# Patient Record
Sex: Female | Born: 1985
Health system: Southern US, Community
[De-identification: ages and names within clinical notes are randomized; demographics above are authoritative.]

## PROBLEM LIST (undated history)

## (undated) DIAGNOSIS — L732 Hidradenitis suppurativa: Secondary | ICD-10-CM

## (undated) DIAGNOSIS — I10 Essential (primary) hypertension: Secondary | ICD-10-CM

---
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
## 2006-08-26 ENCOUNTER — Emergency Department (HOSPITAL_COMMUNITY): Admission: EM | Admit: 2006-08-26 | Discharge: 2006-08-26 | Payer: Self-pay | Admitting: Emergency Medicine

## 2006-09-27 ENCOUNTER — Emergency Department (HOSPITAL_COMMUNITY): Admission: EM | Admit: 2006-09-27 | Discharge: 2006-09-27 | Payer: Self-pay | Admitting: Emergency Medicine

## 2006-12-20 ENCOUNTER — Inpatient Hospital Stay (HOSPITAL_COMMUNITY): Admission: AD | Admit: 2006-12-20 | Discharge: 2006-12-21 | Payer: Self-pay | Admitting: Family Medicine

## 2006-12-20 ENCOUNTER — Emergency Department (HOSPITAL_COMMUNITY): Admission: EM | Admit: 2006-12-20 | Discharge: 2006-12-20 | Payer: Self-pay | Admitting: Emergency Medicine

## 2006-12-21 ENCOUNTER — Encounter (INDEPENDENT_AMBULATORY_CARE_PROVIDER_SITE_OTHER): Payer: Self-pay | Admitting: Gynecology

## 2006-12-21 ENCOUNTER — Ambulatory Visit (HOSPITAL_COMMUNITY): Admission: AD | Admit: 2006-12-21 | Discharge: 2006-12-21 | Payer: Self-pay | Admitting: Gynecology

## 2006-12-21 ENCOUNTER — Ambulatory Visit: Payer: Self-pay | Admitting: Gynecology

## 2006-12-21 HISTORY — PX: DILATION AND EVACUATION: SHX1459

## 2007-02-13 ENCOUNTER — Emergency Department (HOSPITAL_COMMUNITY): Admission: EM | Admit: 2007-02-13 | Discharge: 2007-02-13 | Payer: Self-pay | Admitting: Emergency Medicine

## 2007-03-01 ENCOUNTER — Ambulatory Visit: Payer: Self-pay | Admitting: Obstetrics & Gynecology

## 2007-07-09 ENCOUNTER — Inpatient Hospital Stay (HOSPITAL_COMMUNITY): Admission: AD | Admit: 2007-07-09 | Discharge: 2007-07-09 | Payer: Self-pay | Admitting: Obstetrics & Gynecology

## 2007-07-11 ENCOUNTER — Inpatient Hospital Stay (HOSPITAL_COMMUNITY): Admission: AD | Admit: 2007-07-11 | Discharge: 2007-07-11 | Payer: Self-pay | Admitting: Obstetrics & Gynecology

## 2007-07-18 ENCOUNTER — Inpatient Hospital Stay (HOSPITAL_COMMUNITY): Admission: AD | Admit: 2007-07-18 | Discharge: 2007-07-18 | Payer: Self-pay | Admitting: Obstetrics & Gynecology

## 2007-08-01 ENCOUNTER — Ambulatory Visit: Payer: Self-pay | Admitting: Obstetrics and Gynecology

## 2007-12-24 ENCOUNTER — Emergency Department (HOSPITAL_COMMUNITY): Admission: EM | Admit: 2007-12-24 | Discharge: 2007-12-24 | Payer: Self-pay | Admitting: Emergency Medicine

## 2008-04-13 ENCOUNTER — Emergency Department (HOSPITAL_COMMUNITY): Admission: EM | Admit: 2008-04-13 | Discharge: 2008-04-13 | Payer: Self-pay | Admitting: Family Medicine

## 2008-05-19 ENCOUNTER — Emergency Department (HOSPITAL_COMMUNITY): Admission: EM | Admit: 2008-05-19 | Discharge: 2008-05-19 | Payer: Self-pay | Admitting: Emergency Medicine

## 2009-01-16 ENCOUNTER — Inpatient Hospital Stay (HOSPITAL_COMMUNITY): Admission: AD | Admit: 2009-01-16 | Discharge: 2009-01-16 | Payer: Self-pay | Admitting: Obstetrics & Gynecology

## 2009-02-23 ENCOUNTER — Inpatient Hospital Stay (HOSPITAL_COMMUNITY): Admission: AD | Admit: 2009-02-23 | Discharge: 2009-02-23 | Payer: Self-pay | Admitting: Obstetrics & Gynecology

## 2009-04-04 ENCOUNTER — Emergency Department (HOSPITAL_COMMUNITY): Admission: EM | Admit: 2009-04-04 | Discharge: 2009-04-04 | Payer: Self-pay | Admitting: Emergency Medicine

## 2009-11-11 ENCOUNTER — Inpatient Hospital Stay (HOSPITAL_COMMUNITY): Admission: AD | Admit: 2009-11-11 | Discharge: 2009-11-11 | Payer: Self-pay | Admitting: Family Medicine

## 2010-02-22 ENCOUNTER — Inpatient Hospital Stay (HOSPITAL_COMMUNITY): Admission: AD | Admit: 2010-02-22 | Discharge: 2010-02-22 | Payer: Self-pay | Admitting: Obstetrics and Gynecology

## 2010-06-20 NOTE — L&D Delivery Note (Signed)
Delivery Note   Pt progressed to complete at 1945, pt then pushed well, and vertex descended, FHR with early/variable decels, overall reassuring and there was continuous descent  At 8:53 PM a viable female was delivered via Vaginal, Spontaneous Delivery (Presentation: Left Occiput Anterior).  APGAR: 8, 9; weight 7 lb 10 oz (3459 g).   Placenta status: Intact, Spontaneous.  Cord: 3 vessels with the following complications: None.   Infant was placed skin to skin on mom's abdomen  Anesthesia: Epidural  Episiotomy: None Lacerations:  Left Sulcus Suture Repair: 3.0 vicryl rapide Est. Blood Loss (mL): 400  Mom to postpartum.  Baby to nursery-stable. Placenta to pathology, secondary to prolonged rupture  Dr Estanislado Pandy notified  Malissa Hippo 02/11/2011, 1:22 AM

## 2010-07-16 ENCOUNTER — Inpatient Hospital Stay (HOSPITAL_COMMUNITY)
Admission: AD | Admit: 2010-07-16 | Discharge: 2010-07-16 | Payer: Self-pay | Source: Home / Self Care | Attending: Obstetrics & Gynecology | Admitting: Obstetrics & Gynecology

## 2010-07-26 ENCOUNTER — Inpatient Hospital Stay (HOSPITAL_COMMUNITY)
Admission: AD | Admit: 2010-07-26 | Discharge: 2010-07-26 | Disposition: A | Discharge status: Home / Self Care | Payer: Medicaid Other | Source: Ambulatory Visit | Attending: Obstetrics & Gynecology | Admitting: Obstetrics & Gynecology

## 2010-07-26 ENCOUNTER — Inpatient Hospital Stay (HOSPITAL_COMMUNITY): Payer: Medicaid Other

## 2010-07-26 ENCOUNTER — Other Ambulatory Visit: Payer: Self-pay | Admitting: Obstetrics & Gynecology

## 2010-07-26 ENCOUNTER — Encounter (HOSPITAL_COMMUNITY): Payer: Self-pay | Admitting: Radiology

## 2010-07-26 DIAGNOSIS — IMO0002 Reserved for concepts with insufficient information to code with codable children: Secondary | ICD-10-CM | POA: Insufficient documentation

## 2010-07-26 DIAGNOSIS — O2650 Maternal hypotension syndrome, unspecified trimester: Secondary | ICD-10-CM

## 2010-07-26 DIAGNOSIS — W1809XA Striking against other object with subsequent fall, initial encounter: Secondary | ICD-10-CM | POA: Insufficient documentation

## 2010-07-26 DIAGNOSIS — R51 Headache: Secondary | ICD-10-CM | POA: Insufficient documentation

## 2010-09-02 LAB — WET PREP, GENITAL
Trich, Wet Prep: NONE SEEN
Yeast Wet Prep HPF POC: NONE SEEN

## 2010-09-02 LAB — GC/CHLAMYDIA PROBE AMP, GENITAL: GC Probe Amp, Genital: NEGATIVE

## 2010-09-06 LAB — URINALYSIS, ROUTINE W REFLEX MICROSCOPIC
Glucose, UA: NEGATIVE mg/dL
Hgb urine dipstick: NEGATIVE
Ketones, ur: 15 mg/dL — AB
Specific Gravity, Urine: >1.03 — ABNORMAL HIGH (ref 1.005–1.030)

## 2010-09-06 LAB — POCT PREGNANCY, URINE: Preg Test, Ur: NEGATIVE

## 2010-09-06 LAB — WET PREP, GENITAL: Yeast Wet Prep HPF POC: NONE SEEN

## 2010-09-06 LAB — GC/CHLAMYDIA PROBE AMP, GENITAL: Chlamydia, DNA Probe: NEGATIVE

## 2010-09-24 LAB — WET PREP, GENITAL
Trich, Wet Prep: NONE SEEN
WBC, Wet Prep HPF POC: NONE SEEN
Yeast Wet Prep HPF POC: NONE SEEN

## 2010-09-24 LAB — GC/CHLAMYDIA PROBE AMP, GENITAL: Chlamydia, DNA Probe: NEGATIVE

## 2010-09-26 LAB — WET PREP, GENITAL: Trich, Wet Prep: NONE SEEN

## 2010-09-26 LAB — GC/CHLAMYDIA PROBE AMP, GENITAL: GC Probe Amp, Genital: NEGATIVE

## 2010-11-02 NOTE — Op Note (Signed)
NAME:  Desiree Daniel, Desiree Daniel      ACCOUNT NO.:  [id number]   MEDICAL RECORD NO.:  [id number]          PATIENT TYPE:  AMB   LOCATION:  [facility]                          FACILITY:  [facility]   PHYSICIAN:  Ginger Carne, MD  DATE OF BIRTH:  27-Jan-1986   DATE OF PROCEDURE:  12/21/2006  DATE OF DISCHARGE:                               OPERATIVE REPORT   PREOPERATIVE DIAGNOSIS:  Is 16-3/[redacted] weeks gestation, incomplete abortion  and shortened cervix.   POSTOPERATIVE DIAGNOSIS:  Is 16-3/[redacted] weeks gestation, incomplete abortion  and shortened cervix.  Chorioamnionitis.   OPERATIVE PROCEDURE:  Is aspiration, dilation and extraction.   SURGEON:  Ginger Carne, M.D.   ASSISTANT:  None.   COMPLICATIONS:  None immediate.   ESTIMATED BLOOD LOSS:  Minimal.   ANESTHESIA:  Spinal.   SPECIMEN:  Products of conception to pathology.   OPERATIVE FINDINGS:  The patient had previously diagnosed 1.6 cm cervix  and in the course of being transferred to the operating theater for  placement of Shirodkar cerclage her membranes had ruptured. Upon the  initial viewing in the operating room there was purulent white material  emanating from the endocervical os.  Products of conception noted,  uterine cavity was smooth.  Uterus sounded to 18 cm.  The cavity was  smooth at the end of the procedure.   OPERATIVE PROCEDURE:  The patient prepped and draped in usual fashion  and placed lithotomy position.  Betadine solution used for antiseptic  and the patient was catheterized prior to procedure.  After adequate  spinal analgesia, tenaculum placed on the anterior lip of the cervix.  Minimal dilatation was required to accommodate a #14 suction curette.  Suction and sharp curettage was utilized for removal of products.  Minimal bleeding noted at the end of the procedure.  Cavity clean at the  end of the procedure.  The patient returned to the post anesthesia  recovery room in excellent condition.      Ginger Carne, MD  Electronically Signed    SHB/MEDQ  D:  12/21/2006  T:  12/21/2006  Job:  [id number]

## 2010-11-02 NOTE — Group Therapy Note (Signed)
NAME:  Desiree Daniel, Desiree Daniel NO.:  [id number]   MEDICAL RECORD NO.:  [id number]          PATIENT TYPE:  WOC   LOCATION:  [facility]                   FACILITY:  [facility]   PHYSICIAN:  Argentina Donovan, MD        DATE OF BIRTH:  [birth year]   DATE OF SERVICE:                                  CLINIC NOTE   The patient is a 25 year old African American female gravida 2, para 0-0-  2-0 who, last year, in July had a D&C after miscarriage of a 16-week  gestation and she recently was seen in the MAU after spontaneous 6-week  miscarriage, complete.  Beta HCG was down to normal on her second  repeat. The patient was in the health department in November and had a  Pap smear at that time.  This time she was in no complaints and desires  birth control.  We placed her on Yasmin and given 3 sample packs  instructed her to the health department as she has no insurance and no  job at this time.  She is looking.  She uses also condoms with sex and  we consulted her about that.  She is not planning pregnancy.  We have  told her should she become pregnant, she should be on prenatal vitamins  with folic acid.   EXAMINATION:  External genitalia is normal.  BUS is within normal  limits.  Vagina is clean, well rugated.  Cervix clean, parous and  nulliparous.  The uterus and adnexa are normal.   IMPRESSION:  Normal gynecological examination.           ______________________________  Argentina Donovan, MD     PR/MEDQ  D:  08/01/2007  T:  08/02/2007  Job:  [id number]

## 2011-02-09 ENCOUNTER — Encounter (HOSPITAL_COMMUNITY): Payer: Self-pay | Admitting: *Deleted

## 2011-02-09 ENCOUNTER — Inpatient Hospital Stay (HOSPITAL_COMMUNITY)
Admission: AD | Admit: 2011-02-09 | Discharge: 2011-02-12 | DRG: 775 | Disposition: A | Discharge status: Home / Self Care | Payer: Medicaid Other | Source: Ambulatory Visit | Attending: Obstetrics and Gynecology | Admitting: Obstetrics and Gynecology

## 2011-02-09 DIAGNOSIS — O4290 Premature rupture of membranes, unspecified as to length of time between rupture and onset of labor, unspecified weeks of gestation: Principal | ICD-10-CM | POA: Diagnosis present

## 2011-02-09 LAB — POCT FERN TEST: Fern Test: POSITIVE

## 2011-02-09 NOTE — Progress Notes (Signed)
Manfred Arch, CNM at bedside. Fern collected. VE done.
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
## 2011-02-09 NOTE — Progress Notes (Signed)
SSE done per cnm.  Fern collected.

## 2011-02-09 NOTE — Progress Notes (Signed)
Pt in for possible srom. States she was walking up stairs and had a gush of fluid.  States she did have to pee, but doesn't believe she did.  Has continued to leak since then.  Has not had to wear a pad.  Denies any bleeding or pain.  + FM.

## 2011-02-10 ENCOUNTER — Encounter (HOSPITAL_COMMUNITY): Payer: Self-pay | Admitting: Anesthesiology

## 2011-02-10 ENCOUNTER — Inpatient Hospital Stay (HOSPITAL_COMMUNITY): Payer: Medicaid Other | Admitting: Anesthesiology

## 2011-02-10 ENCOUNTER — Encounter (HOSPITAL_COMMUNITY): Payer: Self-pay | Admitting: *Deleted

## 2011-02-10 LAB — RPR: RPR: NONREACTIVE

## 2011-02-10 LAB — STREP B DNA PROBE: GBS: NEGATIVE

## 2011-02-10 LAB — HEPATITIS B SURFACE ANTIGEN: Hepatitis B Surface Ag: NEGATIVE

## 2011-02-10 LAB — CBC
HCT: 33.1 % — ABNORMAL LOW (ref 36.0–46.0)
MCHC: 34.1 g/dL (ref 30.0–36.0)
MCV: 84.9 fL (ref 78.0–100.0)
Platelets: 260 10*3/uL (ref 150–400)
RDW: 14.7 % (ref 11.5–15.5)
WBC: 10.5 10*3/uL (ref 4.0–10.5)

## 2011-02-10 LAB — HIV ANTIBODY (ROUTINE TESTING W REFLEX): HIV: NONREACTIVE

## 2011-02-10 LAB — RUBELLA ANTIBODY, IGM: Rubella: NON-IMMUNE/NOT IMMUNE

## 2011-02-10 MED ORDER — LIDOCAINE HCL 1.5 % IJ SOLN
INTRAMUSCULAR | Status: DC | PRN
  Administered 2011-02-10 (×2): 5 mL via EPIDURAL

## 2011-02-10 MED ORDER — ONDANSETRON HCL 4 MG/2ML IJ SOLN: 4.0000 mg | Freq: Four times a day (QID) | INTRAMUSCULAR | Status: DC | PRN

## 2011-02-10 MED ORDER — SODIUM CHLORIDE 0.9 % IV SOLN
3.0000 g | Freq: Once | INTRAVENOUS | Status: AC
  Administered 2011-02-10: 3 g via INTRAVENOUS
  Filled 2011-02-10: qty 3

## 2011-02-10 MED ORDER — PHENYLEPHRINE 40 MCG/ML (10ML) SYRINGE FOR IV PUSH (FOR BLOOD PRESSURE SUPPORT)
80.0000 ug | PREFILLED_SYRINGE | INTRAVENOUS | Status: DC | PRN
  Filled 2011-02-10: qty 5

## 2011-02-10 MED ORDER — LACTATED RINGERS IV SOLN
500.0000 mL | INTRAVENOUS | Status: DC | PRN
  Administered 2011-02-10: 1000 mL via INTRAVENOUS
  Administered 2011-02-10: 125 mL via INTRAVENOUS
  Administered 2011-02-10: 300 mL via INTRAVENOUS

## 2011-02-10 MED ORDER — SODIUM CHLORIDE 0.9 % IV SOLN: 250.0000 mL | INTRAVENOUS | Status: DC

## 2011-02-10 MED ORDER — ZOLPIDEM TARTRATE 10 MG PO TABS
10.0000 mg | ORAL_TABLET | Freq: Every evening | ORAL | Status: DC | PRN
  Administered 2011-02-10: 10 mg via ORAL

## 2011-02-10 MED ORDER — OXYCODONE-ACETAMINOPHEN 5-325 MG PO TABS: 2.0000 | ORAL_TABLET | ORAL | Status: DC | PRN

## 2011-02-10 MED ORDER — DIPHENHYDRAMINE HCL 50 MG/ML IJ SOLN: 12.5000 mg | INTRAMUSCULAR | Status: DC | PRN

## 2011-02-10 MED ORDER — CITRIC ACID-SODIUM CITRATE 334-500 MG/5ML PO SOLN
30.0000 mL | ORAL | Status: DC | PRN
  Filled 2011-02-10: qty 15

## 2011-02-10 MED ORDER — EPHEDRINE 5 MG/ML INJ
10.0000 mg | INTRAVENOUS | Status: DC | PRN
  Filled 2011-02-10: qty 4

## 2011-02-10 MED ORDER — IBUPROFEN 600 MG PO TABS: 600.0000 mg | ORAL_TABLET | Freq: Four times a day (QID) | ORAL | Status: DC | PRN

## 2011-02-10 MED ORDER — TERBUTALINE SULFATE 1 MG/ML IJ SOLN: 0.2500 mg | Freq: Once | INTRAMUSCULAR | Status: AC | PRN

## 2011-02-10 MED ORDER — OXYTOCIN 20 UNITS IN LACTATED RINGERS INFUSION - SIMPLE
1.0000 m[IU]/min | INTRAVENOUS | Status: DC
  Administered 2011-02-10: 333 m[IU]/min via INTRAVENOUS
  Administered 2011-02-10: 1 m[IU]/min via INTRAVENOUS
  Filled 2011-02-10: qty 1000

## 2011-02-10 MED ORDER — BUTORPHANOL TARTRATE 2 MG/ML IJ SOLN: 1.0000 mg | INTRAMUSCULAR | Status: DC | PRN

## 2011-02-10 MED ORDER — OXYTOCIN 20 UNITS IN LACTATED RINGERS INFUSION - SIMPLE: 125.0000 mL/h | INTRAVENOUS | Status: AC

## 2011-02-10 MED ORDER — SODIUM CHLORIDE 0.9 % IJ SOLN: 3.0000 mL | INTRAMUSCULAR | Status: DC | PRN

## 2011-02-10 MED ORDER — LIDOCAINE HCL (PF) 1 % IJ SOLN
30.0000 mL | INTRAMUSCULAR | Status: DC | PRN
  Filled 2011-02-10 (×2): qty 30

## 2011-02-10 MED ORDER — FLEET ENEMA 7-19 GM/118ML RE ENEM: 1.0000 | ENEMA | RECTAL | Status: DC | PRN

## 2011-02-10 MED ORDER — LACTATED RINGERS IV SOLN
500.0000 mL | Freq: Once | INTRAVENOUS | Status: AC
  Administered 2011-02-10: 1000 mL via INTRAVENOUS

## 2011-02-10 MED ORDER — FENTANYL 2.5 MCG/ML BUPIVACAINE 1/10 % EPIDURAL INFUSION (WH - ANES)
14.0000 mL/h | INTRAMUSCULAR | Status: DC
  Administered 2011-02-10 (×4): 14 mL/h via EPIDURAL
  Filled 2011-02-10 (×3): qty 60

## 2011-02-10 MED ORDER — OXYTOCIN BOLUS FROM INFUSION
500.0000 mL | Freq: Once | INTRAVENOUS | Status: DC
  Filled 2011-02-10: qty 500

## 2011-02-10 MED ORDER — SODIUM CHLORIDE 0.9 % IJ SOLN: 3.0000 mL | Freq: Two times a day (BID) | INTRAMUSCULAR | Status: DC

## 2011-02-10 MED ORDER — ACETAMINOPHEN 325 MG PO TABS
650.0000 mg | ORAL_TABLET | ORAL | Status: DC | PRN
  Administered 2011-02-10: 650 mg via ORAL
  Filled 2011-02-10: qty 2

## 2011-02-10 NOTE — Progress Notes (Signed)
  Subjective: Now on [employer].  Unaware of contractions.  Objective: BP 121/74  Pulse 106  Temp(Src) 98.1 F (36.7 C) (Oral)  Resp 20  Ht 5\' 4"  (1.626 m)  Wt 85.004 kg (187 lb 6.4 oz)  BMI 32.17 kg/m2     FHT:  FHR: 130-140 bpm, variability: moderate,  accelerations:  Present,  decelerations:  Absent UC:   irregular, every 5-8 minutes  Labs: Lab Results  Component Value Date   WBC 10.5 02/10/2011   HGB 11.3* 02/10/2011   HCT 33.1* 02/10/2011   MCV 84.9 02/10/2011   PLT 260 02/10/2011    Assessment / Plan: PROM prior to labor GBS negative Will continue to observe, Ambien prn If no change in labor status by 9 am, will start Pitocin augmentation.  Hinley Brimage L 02/10/2011, 1:36 AM

## 2011-02-10 NOTE — Progress Notes (Signed)
   Subjective: Pt breathing through ctx, now getting uncomfortable, requests pain medication  Objective: BP 106/60  Pulse 113  Temp(Src) 98.5 F (36.9 C) (Oral)  Resp 20  Ht 5\' 4"  (1.626 m)  Wt 85.004 kg (187 lb 6.4 oz)  BMI 32.17 kg/m2      FHT:  FHR: 130 bpm, variability: moderate,  accelerations:  Abscent,  decelerations:  Absent UC:   regular, every 2-3 minutes SVE:   Dilation: 1 Effacement (%): 60 Station: -1 Exam by:: Manfred Arch, CNM  Labs: Lab Results  Component Value Date   WBC 10.5 02/10/2011   HGB 11.3* 02/10/2011   HCT 33.1* 02/10/2011   MCV 84.9 02/10/2011   PLT 260 02/10/2011    Assessment / Plan: Spontaneous labor, progressing normally  Labor: Progressing normally Preeclampsia:  no signs or symptoms of toxicity Fetal Wellbeing:  Category I Pain Control:  Epidural will administer now I/D:  n/a Anticipated MOD:  NSVD   Ivelisse Culverhouse M 02/10/2011, 8:06 AM

## 2011-02-10 NOTE — Progress Notes (Signed)
   Subjective: Remains comfortable with epidural  Objective: BP 132/75  Pulse 95  Temp(Src) 98.3 F (36.8 C) (Oral)  Resp 20  Ht 5\' 4"  (1.626 m)  Wt 85.004 kg (187 lb 6.4 oz)  BMI 32.17 kg/m2  SpO2 99%      FHT:  FHR: 130 bpm, variability: minimal ,  accelerations:  Abscent,  decelerations:  Present early, variable UC:   regular, every 2-3 minutes SVE:   Dilation: 6 Effacement (%): 100 Station: -1 Exam by:: felkel,rn  Labs: Lab Results  Component Value Date   WBC 10.5 02/10/2011   HGB 11.3* 02/10/2011   HCT 33.1* 02/10/2011   MCV 84.9 02/10/2011   PLT 260 02/10/2011    Assessment / Plan: SROM at 2100 on 8/22   Labor: Progressing normally Preeclampsia:  no signs or symptoms of toxicity Fetal Wellbeing:  Category II Pain Control:  Epidural I/D:  n/a Anticipated MOD:  NSVD  Discussed Dr Pura Spice 02/10/2011, 3:39 PM

## 2011-02-10 NOTE — Progress Notes (Signed)
Subjective: Has rested through night, s/p Ambien.  No increase in UCs at present.  Objective: BP 106/60  Pulse 113  Temp(Src) 98.2 F (36.8 C) (Oral)  Resp 20  Ht 5\' 4"  (1.626 m)  Wt 85.004 kg (187 lb 6.4 oz)  BMI 32.17 kg/m2      FHT:  FHR: 150s bpm, variability: moderate,  accelerations:  Present,  decelerations:  Absent.  10x 10 accels at intervals. UC:   regular, every 4 minutes, mild. Leaking clear fluid  Labs: Lab Results  Component Value Date   WBC 10.5 02/10/2011   HGB 11.3* 02/10/2011   HCT 33.1* 02/10/2011   MCV 84.9 02/10/2011   PLT 260 02/10/2011    Assessment / Plan: SROM, latent phase labor GBS negative If no labor increase this am, anticipate initiation of pitocin augmentation. Patient agreeable with plan.   Isaul Landi L 02/10/2011, 6:43 AM

## 2011-02-10 NOTE — Anesthesia Procedure Notes (Addendum)
Epidural Patient location during procedure: OB Start time: 02/10/2011 8:52 AM  Staffing Performed by: anesthesiologist   Preanesthetic Checklist Completed: patient identified, site marked, surgical consent, pre-op evaluation, timeout performed, IV checked, risks and benefits discussed and monitors and equipment checked  Epidural Patient position: sitting Prep: site prepped and draped and DuraPrep Patient monitoring: continuous pulse ox and blood pressure Approach: midline Injection technique: LOR air and LOR saline  Needle:  Needle type: Tuohy  Needle gauge: 17 G Needle length: 9 cm Needle insertion depth: 7 cm Catheter type: closed end flexible Catheter size: 19 Gauge Catheter at skin depth: 12 cm Test dose: negative  Assessment Events: blood not aspirated, injection not painful, no injection resistance, negative IV test and no paresthesia  Additional Notes Patient identified.  Risk benefits discussed including failed block, incomplete pain control, headache, nerve damage, paralysis, blood pressure changes, nausea, vomiting, reactions to medication both toxic or allergic, and postpartum back pain.  Patient expressed understanding and wished to proceed.  All questions were answered.  Sterile technique used throughout procedure and epidural site dressed with sterile barrier dressing. No paresthesia or other complications noted.The patient did not experience any signs of intravascular injection such as tinnitus or metallic taste in mouth nor signs of intrathecal spread such as rapid motor block. Please see nursing notes for vital signs.

## 2011-02-10 NOTE — Progress Notes (Addendum)
  Subjective: Comfortable after epidural,   Objective: BP 121/66  Pulse 86  Temp(Src) 97.6 F (36.4 C) (Oral)  Resp 18  Ht 5\' 4"  (1.626 m)  Wt 85.004 kg (187 lb 6.4 oz)  BMI 32.17 kg/m2  SpO2 99%      FHT:  FHR: 130 bpm, variability: moderate,  accelerations:  Abscent,  decelerations:  Present some early variables, few with late component UC:   regular, every 2-3 minutes SVE:   Dilation: 4 Effacement (%): 90 Station: -1 Exam by:: felkel,rn  Labs: Lab Results  Component Value Date   WBC 10.5 02/10/2011   HGB 11.3* 02/10/2011   HCT 33.1* 02/10/2011   MCV 84.9 02/10/2011   PLT 260 02/10/2011    Assessment / Plan: Spontaneous labor, progressing normally  Labor: Progressing normally Preeclampsia:  no signs or symptoms of toxicity Fetal Wellbeing:  Category II Pain Control:  Epidural I/D:  n/a Anticipated MOD:  NSVD  Will start amnioinfusion Will update dr Pura Spice 02/10/2011, 10:55 AM

## 2011-02-10 NOTE — Progress Notes (Signed)
Will call back with bed.  prioritizing pt bed placement.

## 2011-02-10 NOTE — Progress Notes (Signed)
Pt may go to room 172.

## 2011-02-10 NOTE — Anesthesia Preprocedure Evaluation (Signed)
Anesthesia Evaluation  Name, MR# and DOB Patient awake  General Assessment Comment  Reviewed: Allergy & Precautions, H&P , Patient's Chart, lab work & pertinent test results  Airway Mallampati: II TM Distance: >3 FB Neck ROM: full    Dental No notable dental hx.    Pulmonary  clear to auscultation  pulmonary exam normalPulmonary Exam Normal breath sounds clear to auscultation none    Cardiovascular regular Normal    Neuro/Psych Negative Neurological ROS  Negative Psych ROS  GI/Hepatic/Renal negative GI ROS  negative Liver ROS  negative Renal ROS        Endo/Other  Negative Endocrine ROS (+)      Abdominal   Musculoskeletal   Hematology negative hematology ROS (+)   Peds  Reproductive/Obstetrics (+) Pregnancy    Anesthesia Other Findings             Anesthesia Physical Anesthesia Plan  ASA: II  Anesthesia Plan: Epidural   Post-op Pain Management:    Induction:   Airway Management Planned:   Additional Equipment:   Intra-op Plan:   Post-operative Plan:   Informed Consent: I have reviewed the patients History and Physical, chart, labs and discussed the procedure including the risks, benefits and alternatives for the proposed anesthesia with the patient or authorized representative who has indicated his/her understanding and acceptance.     Plan Discussed with:   Anesthesia Plan Comments:         Anesthesia Quick Evaluation  

## 2011-02-11 ENCOUNTER — Other Ambulatory Visit: Payer: Self-pay | Admitting: Obstetrics and Gynecology

## 2011-02-11 LAB — CBC
HCT: 27.7 % — ABNORMAL LOW (ref 36.0–46.0)
MCH: 28.7 pg (ref 26.0–34.0)
MCHC: 33.6 g/dL (ref 30.0–36.0)
MCV: 85.5 fL (ref 78.0–100.0)
RDW: 14.8 % (ref 11.5–15.5)

## 2011-02-11 MED ORDER — PRENATAL PLUS 27-1 MG PO TABS
1.0000 | ORAL_TABLET | Freq: Every day | ORAL | Status: DC
  Administered 2011-02-11 – 2011-02-12 (×2): 1 via ORAL
  Filled 2011-02-11 (×2): qty 1

## 2011-02-11 MED ORDER — MEASLES, MUMPS & RUBELLA VAC ~~LOC~~ INJ
0.5000 mL | INJECTION | Freq: Once | SUBCUTANEOUS | Status: AC
  Administered 2011-02-12: 0.5 mL via SUBCUTANEOUS
  Filled 2011-02-11 (×2): qty 0.5

## 2011-02-11 MED ORDER — DIBUCAINE 1 % RE OINT: 1.0000 "application " | TOPICAL_OINTMENT | RECTAL | Status: DC | PRN

## 2011-02-11 MED ORDER — LANOLIN HYDROUS EX OINT: TOPICAL_OINTMENT | CUTANEOUS | Status: DC | PRN

## 2011-02-11 MED ORDER — TETANUS-DIPHTH-ACELL PERTUSSIS 5-2.5-18.5 LF-MCG/0.5 IM SUSP
0.5000 mL | Freq: Once | INTRAMUSCULAR | Status: AC
  Administered 2011-02-12: 0.5 mL via INTRAMUSCULAR

## 2011-02-11 MED ORDER — SENNOSIDES-DOCUSATE SODIUM 8.6-50 MG PO TABS
2.0000 | ORAL_TABLET | Freq: Every day | ORAL | Status: DC
  Administered 2011-02-11: 2 via ORAL

## 2011-02-11 MED ORDER — IBUPROFEN 600 MG PO TABS
600.0000 mg | ORAL_TABLET | Freq: Four times a day (QID) | ORAL | Status: DC
  Administered 2011-02-11 – 2011-02-12 (×6): 600 mg via ORAL
  Filled 2011-02-11 (×6): qty 1

## 2011-02-11 MED ORDER — DIPHENHYDRAMINE HCL 25 MG PO CAPS: 25.0000 mg | ORAL_CAPSULE | Freq: Four times a day (QID) | ORAL | Status: DC | PRN

## 2011-02-11 MED ORDER — ONDANSETRON HCL 4 MG PO TABS: 4.0000 mg | ORAL_TABLET | ORAL | Status: DC | PRN

## 2011-02-11 MED ORDER — WITCH HAZEL-GLYCERIN EX PADS: 1.0000 "application " | MEDICATED_PAD | CUTANEOUS | Status: DC | PRN

## 2011-02-11 MED ORDER — OXYCODONE-ACETAMINOPHEN 5-325 MG PO TABS
1.0000 | ORAL_TABLET | ORAL | Status: DC | PRN
  Administered 2011-02-11 (×2): 1 via ORAL
  Filled 2011-02-11 (×2): qty 1

## 2011-02-11 MED ORDER — SIMETHICONE 80 MG PO CHEW: 80.0000 mg | CHEWABLE_TABLET | ORAL | Status: DC | PRN

## 2011-02-11 MED ORDER — ZOLPIDEM TARTRATE 5 MG PO TABS: 5.0000 mg | ORAL_TABLET | Freq: Every evening | ORAL | Status: DC | PRN

## 2011-02-11 MED ORDER — BENZOCAINE-MENTHOL 20-0.5 % EX AERO: 1.0000 "application " | INHALATION_SPRAY | CUTANEOUS | Status: DC | PRN

## 2011-02-11 NOTE — Progress Notes (Signed)
.  Subjective:  Pt remains comfortable with epidural    Objective: BP 121/65  Pulse 119  Temp(Src) 99.7 F (37.6 C) (Oral)  Resp 20  Ht 5\' 4"  (1.626 m)  Wt 85.004 kg (187 lb 6.4 oz)  BMI 32.17 kg/m2  SpO2 99%  Breastfeeding? Unknown   FHT:  FHR: 130 bpm, variability: moderate,  accelerations:  Present,  decelerations:  Present variables UC:   regular, every 2-3 minutes SVE:   8cm per RN after variable decel   Assessment / Plan: Protracted active phase  Fetal Wellbeing:  Category I Pain Control:  Epidural  Slow cervical change, FHR with periods of decreased variability, responds to scalp stim, overall reassuring  Update Dr Estanislado Pandy, PRN   Malissa Hippo 02/11/2011, 1:19 AM

## 2011-02-11 NOTE — Progress Notes (Signed)
UR chart review completed.  

## 2011-02-11 NOTE — Anesthesia Postprocedure Evaluation (Signed)
Anesthesia Post Note  Patient: Desiree Daniel  Procedure(s) Performed: * No procedures listed *  Anesthesia type: Epidural  Patient location: Mother/Baby  Post pain: Pain level controlled  Post assessment: Post-op Vital signs reviewed  Last Vitals:  Filed Vitals:   02/11/11 0500  BP: 124/79  Pulse: 118  Temp: 98.1 F (36.7 C)  Resp: 20    Post vital signs: Reviewed  Level of consciousness: awake  Complications: No apparent anesthesia complications

## 2011-02-11 NOTE — Progress Notes (Signed)
Post Partum Day 1 Subjective: up ad lib, voiding, tolerating PO and c/o mod uterine cramping and perineal pain Breast feeding well established, pt states infant nurses more on Right than Left Infant named "Natalia"  Objective: Blood pressure 124/79, pulse 118, temperature 98.1 F (36.7 C), temperature source Oral, resp. rate 20, height 5\' 4"  (1.626 m), weight 85.004 kg (187 lb 6.4 oz), SpO2 99.00%, unknown if currently breastfeeding.  Physical Exam:  General: alert, cooperative and mild distress Lochia: appropriate Uterine Fundus: firm Incision: healing well DVT Evaluation: No evidence of DVT seen on physical exam.   Basename 02/11/11 0510 02/10/11 0047  HGB 9.3* 11.3*  HCT 27.7* 33.1*    Assessment/Plan: Plan for discharge tomorrow and Breastfeeding   LOS: 2 days   Anabel Halon 02/11/2011, 10:08 AM

## 2011-02-12 MED ORDER — FERROUS SULFATE 325 (65 FE) MG PO TABS: 325.0000 mg | ORAL_TABLET | Freq: Every day | ORAL | Status: DC

## 2011-02-12 MED ORDER — OXYCODONE-ACETAMINOPHEN 5-325 MG PO TABS: 1.0000 | ORAL_TABLET | ORAL | Status: AC | PRN

## 2011-02-12 MED ORDER — IBUPROFEN 600 MG PO TABS: 600.0000 mg | ORAL_TABLET | Freq: Four times a day (QID) | ORAL | Status: AC

## 2011-02-12 NOTE — Progress Notes (Signed)
Post Partum Day 2 Subjective: no complaints and tolerating PO  Objective: Blood pressure 108/68, pulse 82, temperature 98.1 F (36.7 C), temperature source Oral, resp. rate 18, height 5\' 4"  (1.626 m), weight 85.004 kg (187 lb 6.4 oz), SpO2 98.00%, unknown if currently breastfeeding.  Physical Exam:  General: alert and no distress Lochia: appropriate Uterine Fundus: firm Incision: NA DVT Evaluation: No evidence of DVT seen on physical exam.   Basename 02/11/11 0510 02/10/11 0047  HGB 9.3* 11.3*  HCT 27.7* 33.1*    Assessment/Plan: Discharge home, Breastfeeding and Contraception Mirena Meds: Motrin, Percocet,Iron, PNV, Mirena IUD   LOS: 3 days   Yancy Hascall V 02/12/2011, 12:05 PM

## 2011-02-12 NOTE — Progress Notes (Signed)
Post Partum Day 2 Subjective: no complaints and tolerating PO  Objective: Blood pressure 108/68, pulse 82, temperature 98.1 F (36.7 C), temperature source Oral, resp. rate 18, height 5\' 4"  (1.626 m), weight 85.004 kg (187 lb 6.4 oz), SpO2 98.00%, unknown if currently breastfeeding.  Physical Exam:  General: alert Lochia: appropriate Uterine Fundus: firm Incision: NA DVT Evaluation: No evidence of DVT seen on physical exam.   Basename 02/11/11 0510 02/10/11 0047  HGB 9.3* 11.3*  HCT 27.7* 33.1*    Assessment/Plan: Discharge home and Contraception Mirena Meds: Motrin Percocet Iron PNV Handout given   LOS: 3 days   Desiree Daniel V 02/12/2011, 11:00 AM

## 2011-02-12 NOTE — Discharge Summary (Signed)
Obstetric Discharge Summary Reason for Admission: onset of labor and rupture of membranes Prenatal Procedures: ultrasound Intrapartum Procedures: spontaneous vaginal delivery Postpartum Procedures: none Complications-Operative and Postpartum: sulcus laceration Hemoglobin  Date Value Range Status  02/11/2011 9.3* 12.0-15.0 (g/dL) Final     DELTA CHECK NOTED     REPEATED TO VERIFY     HCT  Date Value Range Status  02/11/2011 27.7* 36.0-46.0 (%) Final    Discharge Diagnoses: Term Pregnancy-delivered  Discharge Information: Date: 02/12/2011 Activity: unrestricted Diet: routine Medications: Ibuprophen, Iron and Percocet Condition: stable Instructions: refer to practice specific booklet Discharge to: home Follow-up Information    Follow up with [facility] in 6 weeks.         Newborn Data: Live born female  Birth Weight: 7 lb 10 oz (3459 g) APGAR: 8, 9  Home with mother.  Joshawn Crissman O. 02/12/2011, 1:05 PM

## 2011-03-10 LAB — CBC
MCHC: 34.4
Platelets: 245
RDW: 13.5

## 2011-03-10 LAB — GC/CHLAMYDIA PROBE AMP, GENITAL: GC Probe Amp, Genital: NEGATIVE

## 2011-03-10 LAB — HCG, QUANTITATIVE, PREGNANCY
hCG, Beta Chain, Quant, S: 1984 — ABNORMAL HIGH
hCG, Beta Chain, Quant, S: 37 — ABNORMAL HIGH
hCG, Beta Chain, Quant, S: 454 — ABNORMAL HIGH

## 2011-03-10 LAB — ABO/RH: ABO/RH(D): O POS

## 2011-03-10 LAB — POCT PREGNANCY, URINE
Operator id: 120561
Preg Test, Ur: POSITIVE

## 2011-03-21 LAB — POCT URINALYSIS DIP (DEVICE)
Bilirubin Urine: NEGATIVE
Glucose, UA: NEGATIVE
Nitrite: POSITIVE — AB
Operator id: 235561
Urobilinogen, UA: 0.2

## 2011-03-21 LAB — URINE CULTURE

## 2011-03-21 LAB — POCT PREGNANCY, URINE: Preg Test, Ur: NEGATIVE

## 2011-04-01 LAB — POCT URINALYSIS DIP (DEVICE)
Bilirubin Urine: NEGATIVE
Glucose, UA: NEGATIVE
Ketones, ur: 15 — AB
Nitrite: NEGATIVE

## 2011-04-01 LAB — POCT PREGNANCY, URINE: Preg Test, Ur: NEGATIVE

## 2011-04-05 LAB — CBC
HCT: 29.9 — ABNORMAL LOW
Hemoglobin: 10.3 — ABNORMAL LOW
Platelets: 260
RBC: 3.43 — ABNORMAL LOW
WBC: 14.5 — ABNORMAL HIGH
WBC: 16.2 — ABNORMAL HIGH

## 2011-04-05 LAB — URINE CULTURE
Colony Count: NO GROWTH
Culture: NO GROWTH

## 2011-04-05 LAB — URINALYSIS, ROUTINE W REFLEX MICROSCOPIC
Glucose, UA: NEGATIVE
Hgb urine dipstick: NEGATIVE
Nitrite: NEGATIVE
Protein, ur: NEGATIVE
Specific Gravity, Urine: 1.02
Urobilinogen, UA: 0.2
pH: 7.5
pH: 7

## 2011-04-05 LAB — WET PREP, GENITAL
Clue Cells Wet Prep HPF POC: NONE SEEN
Trich, Wet Prep: NONE SEEN
Yeast Wet Prep HPF POC: NONE SEEN

## 2011-04-05 LAB — URINE MICROSCOPIC-ADD ON

## 2011-04-05 LAB — HCG, QUANTITATIVE, PREGNANCY: hCG, Beta Chain, Quant, S: 41348 — ABNORMAL HIGH

## 2011-10-28 ENCOUNTER — Emergency Department (INDEPENDENT_AMBULATORY_CARE_PROVIDER_SITE_OTHER)
Admission: EM | Admit: 2011-10-28 | Discharge: 2011-10-28 | Disposition: A | Discharge status: Home / Self Care | Payer: Self-pay | Source: Home / Self Care | Attending: Emergency Medicine | Admitting: Emergency Medicine

## 2011-10-28 ENCOUNTER — Encounter (HOSPITAL_COMMUNITY): Payer: Self-pay | Admitting: Emergency Medicine

## 2011-10-28 DIAGNOSIS — K029 Dental caries, unspecified: Secondary | ICD-10-CM

## 2011-10-28 DIAGNOSIS — K044 Acute apical periodontitis of pulpal origin: Secondary | ICD-10-CM

## 2011-10-28 DIAGNOSIS — K047 Periapical abscess without sinus: Secondary | ICD-10-CM

## 2011-10-28 MED ORDER — CHLORHEXIDINE GLUCONATE 0.12 % MT SOLN: OROMUCOSAL | Status: AC

## 2011-10-28 MED ORDER — LIDOCAINE VISCOUS 2 % MT SOLN: 10.0000 mL | OROMUCOSAL | Status: AC | PRN

## 2011-10-28 MED ORDER — HYDROCODONE-ACETAMINOPHEN 5-325 MG PO TABS: ORAL_TABLET | ORAL | Status: AC

## 2011-10-28 MED ORDER — PENICILLIN V POTASSIUM 500 MG PO TABS: 500.0000 mg | ORAL_TABLET | Freq: Four times a day (QID) | ORAL | Status: AC

## 2011-10-28 MED ORDER — IBUPROFEN 600 MG PO TABS: 600.0000 mg | ORAL_TABLET | Freq: Four times a day (QID) | ORAL | Status: AC | PRN

## 2011-10-28 NOTE — ED Provider Notes (Signed)
History     CSN: [id number]  Arrival date & time 10/28/11  1545   First MD Initiated Contact with Patient 10/28/11 [phone]      Chief Complaint  Patient presents with  . Dental Pain    (Consider location/radiation/quality/duration/timing/severity/associated sxs/prior treatment) HPI Comments: Patient has several decayed teeth, states that the upper right posterior molar started hurting several days ago. Taking "lots of" ibuprofen, without relief. Denies any recent trauma to the tooth.  Patient is a 26 y.o. female presenting with tooth pain. The history is provided by the patient. No language interpreter was used.  Dental PainThe primary symptoms include mouth pain. Primary symptoms do not include dental injury, oral bleeding, oral lesions, headaches, fever, shortness of breath, sore throat, angioedema or cough. The symptoms began 3 to 5 days ago. The symptoms are unchanged. The symptoms occur constantly.  Additional symptoms include: dental sensitivity to temperature and gum tenderness. Additional symptoms do not include: gum swelling, purulent gums, trismus, jaw pain, facial swelling, trouble swallowing, drooling and ear pain. Medical issues include: periodontal disease. Medical issues do not include: smoking.    Past Medical History  Diagnosis Date  . No pertinent past medical history     Past Surgical History  Procedure Date  . Dilation and curettage of uterus     History reviewed. No pertinent family history.  History  Substance Use Topics  . Smoking status: Never Smoker   . Smokeless tobacco: Not on file  . Alcohol Use: No    OB History    Grav Para Term Preterm Abortions TAB SAB Ect Mult Living   3 1 1  0 1 0 1 0 0 1      Review of Systems  Constitutional: Negative for fever.  HENT: Negative for ear pain, sore throat, facial swelling, drooling and trouble swallowing.   Respiratory: Negative for cough and shortness of breath.   Neurological: Negative for headaches.      Allergies  Review of patient's allergies indicates no known allergies.  Home Medications   Current Outpatient Rx  Name Route Sig Dispense Refill  . CHLORHEXIDINE GLUCONATE 0.12 % MT SOLN  15 mL swish and spit bid 120 mL 0  . HYDROCODONE-ACETAMINOPHEN 5-325 MG PO TABS  1-2 tabs q 6hr prn pain 20 tablet 0  . IBUPROFEN 600 MG PO TABS Oral Take 1 tablet (600 mg total) by mouth every 6 (six) hours as needed for pain. 30 tablet 0  . LIDOCAINE VISCOUS 2 % MT SOLN Oral Take 10 mLs by mouth as needed for pain. Hold in mouth and spit. Do not swallow. 100 mL 0  . PENICILLIN V POTASSIUM 500 MG PO TABS Oral Take 1 tablet (500 mg total) by mouth 4 (four) times daily. X 10 days 40 tablet 0    BP 122/85  Pulse 78  Temp(Src) 98.8 F (37.1 C) (Oral)  Resp 16  SpO2 99%  LMP 10/25/2011  Breastfeeding? No  Physical Exam  Nursing note and vitals reviewed. Constitutional: She is oriented to person, place, and time. She appears well-developed and well-nourished. No distress.  HENT:  Head: Normocephalic and atraumatic. No trismus in the jaw.  Mouth/Throat: Uvula is midline, oropharynx is clear and moist and mucous membranes are normal. Abnormal dentition. Dental caries present. No dental abscesses or uvula swelling.         No gingival swelling, purulence.   Eyes: Conjunctivae and EOM are normal.  Neck: Normal range of motion.  Cardiovascular: Normal rate.  Pulmonary/Chest: Effort normal.  Abdominal: She exhibits no distension.  Musculoskeletal: Normal range of motion.  Neurological: She is alert and oriented to person, place, and time.  Skin: Skin is warm and dry.  Psychiatric: She has a normal mood and affect. Her behavior is normal. Judgment and thought content normal.    ED Course  Procedures (including critical care time)  Labs Reviewed - No data to display No results found.   1. Dental decay   2. Dental infection       MDM  Pt declined dental block. Home with Peridex,  Norco, ibuprofen, viscous lidocaine, penicillin. Referring to Dr. Melynda Ripple, dentist on call.   Luiz Blare, MD 10/28/11 [phone]

## 2011-10-28 NOTE — Discharge Instructions (Signed)
Go to www.goodrx.com to look up your medications. This will give you a list of where you can find your prescriptions at the most affordable prices.  ° °RESOURCE GUIDE ° °Dental Problems ° °Look up http://www.ncdental.org/ncds/Schedule.asp for a schedule of the [facility] [facility]'s free dental clinics called [facility]. They have clinics all around [place]. Get there early and be prepared to wait.  ° °[facility] °[address]  [place], [place] [address] °[phone] ° °[facility] °[address] °[place], [place] °[phone] ° °Patients with Medicaid: °[facility]                     [facility] °[address]                                [address] °Phone:  [phone]                                                  Phone:  [phone] ° °If unable to pay or uninsured, contact:  [facility] or [facility] to become qualified for the adult dental clinic. ° °

## 2011-10-28 NOTE — ED Notes (Signed)
Toothache onset a few days ago.  Pain top, right tooth.

## 2012-07-09 ENCOUNTER — Emergency Department (HOSPITAL_COMMUNITY)
Admission: EM | Admit: 2012-07-09 | Discharge: 2012-07-09 | Disposition: A | Discharge status: Home / Self Care | Payer: Self-pay | Attending: Emergency Medicine | Admitting: Emergency Medicine

## 2012-07-09 ENCOUNTER — Encounter (HOSPITAL_COMMUNITY): Payer: Self-pay | Admitting: Family Medicine

## 2012-07-09 DIAGNOSIS — K0889 Other specified disorders of teeth and supporting structures: Secondary | ICD-10-CM

## 2012-07-09 DIAGNOSIS — K089 Disorder of teeth and supporting structures, unspecified: Secondary | ICD-10-CM | POA: Insufficient documentation

## 2012-07-09 MED ORDER — PENICILLIN V POTASSIUM 500 MG PO TABS: 500.0000 mg | ORAL_TABLET | Freq: Four times a day (QID) | ORAL | Status: DC

## 2012-07-09 MED ORDER — OXYCODONE-ACETAMINOPHEN 5-325 MG PO TABS
1.0000 | ORAL_TABLET | Freq: Once | ORAL | Status: AC
  Administered 2012-07-09: 1 via ORAL
  Filled 2012-07-09: qty 1

## 2012-07-09 MED ORDER — PERCOCET 5-325 MG PO TABS: 1.0000 | ORAL_TABLET | Freq: Four times a day (QID) | ORAL | Status: DC | PRN

## 2012-07-09 NOTE — ED Notes (Signed)
PA at bedside. [name]. 

## 2012-07-09 NOTE — ED Notes (Signed)
Ambulated to [facility] room 11. C/O right upper dental pain. No acute distress noted.

## 2012-07-09 NOTE — ED Provider Notes (Signed)
Medical screening examination/treatment/procedure(s) were performed by non-physician practitioner and as supervising physician I was immediately available for consultation/collaboration.   Divit Stipp, MD 07/09/12 1507 

## 2012-07-09 NOTE — ED Notes (Signed)
Per pt sts right upper dental pain that started 2 days ago.

## 2012-07-09 NOTE — ED Provider Notes (Signed)
History     CSN: [id number]  Arrival date & time 07/09/12  1225   First MD Initiated Contact with Patient 07/09/12 1321      Chief Complaint  Patient presents with  . Dental Pain    (Consider location/radiation/quality/duration/timing/severity/associated sxs/prior treatment) HPI Comments: Patient presents to the emergency department with a dental complaint. Symptoms began 2 days ago. The patient has tried to alleviate pain with nothing.  Pain rated at a 10/10, characterized as throbbing in nature and located tight upper molar region. Patient denies fever, night sweats, chills, difficulty swallowing or opening mouth, SOB, nuchal rigidity or decreased ROM of neck.  Patient does not have a dentist and requests a resource guide at discharge.   Patient is a 27 y.o. female presenting with tooth pain. The history is provided by the patient.  Dental Pain   Past Medical History  Diagnosis Date  . No pertinent past medical history     Past Surgical History  Procedure Date  . Dilation and curettage of uterus     History reviewed. No pertinent family history.  History  Substance Use Topics  . Smoking status: Never Smoker   . Smokeless tobacco: Not on file  . Alcohol Use: No    OB History    Grav Para Term Preterm Abortions TAB SAB Ect Mult Living   3 1 1  0 1 0 1 0 0 1      Review of Systems  All other systems reviewed and are negative.    Allergies  Review of patient's allergies indicates no known allergies.  Home Medications   Current Outpatient Rx  Name  Route  Sig  Dispense  Refill  . ACETAMINOPHEN 500 MG PO TABS   Oral   Take 1,000-2,000 mg by mouth every 6 (six) hours as needed. For pain           BP 131/81  Pulse 76  Temp 98.1 F (36.7 C) (Oral)  Resp 14  SpO2 100%  Physical Exam  Nursing note and vitals reviewed. Constitutional: She is oriented to person, place, and time. She appears well-developed and well-nourished. No distress.  HENT:  Head:  Normocephalic and atraumatic. No trismus in the jaw.  Mouth/Throat: Uvula is midline, oropharynx is clear and moist and mucous membranes are normal. Abnormal dentition. No dental abscesses or uvula swelling. No oropharyngeal exudate, posterior oropharyngeal edema, posterior oropharyngeal erythema or tonsillar abscesses.       Pt able to open and close mouth with out difficulty. Airway intact. Uvula midline. Mild gingival swelling with tenderness over affected area, but no fluctuance. No swelling or tenderness of submental and submandibular regions.  Eyes: Conjunctivae normal and EOM are normal.  Neck: Normal range of motion and full passive range of motion without pain. Neck supple.  Cardiovascular: Normal rate and regular rhythm.   Pulmonary/Chest: Effort normal and breath sounds normal. No stridor. No respiratory distress. She has no wheezes.  Musculoskeletal: Normal range of motion.  Lymphadenopathy:       Head (right side): No submental, no submandibular, no tonsillar, no preauricular and no posterior auricular adenopathy present.       Head (left side): No submental, no submandibular, no tonsillar, no preauricular and no posterior auricular adenopathy present.    She has no cervical adenopathy.  Neurological: She is alert and oriented to person, place, and time.  Skin: Skin is warm and dry. No rash noted. She is not diaphoretic.    ED Course  Procedures (including  critical care time)  Labs Reviewed - No data to display No results found.   No diagnosis found.    MDM  Patient with toothache.  No gross abscess.  Exam unconcerning for Ludwig's angina or spread of infection.  Question if exposed pulp present. Will treat with penicillin and pain medicine.  Urged patient to follow-up with dentist.           Jaci Carrel, PA-C 07/09/12 1407

## 2014-04-21 ENCOUNTER — Encounter (HOSPITAL_COMMUNITY): Payer: Self-pay | Admitting: Family Medicine

## 2014-08-07 ENCOUNTER — Encounter (HOSPITAL_COMMUNITY): Payer: Self-pay | Admitting: *Deleted

## 2014-08-07 ENCOUNTER — Inpatient Hospital Stay (HOSPITAL_COMMUNITY)
Admission: AD | Admit: 2014-08-07 | Discharge: 2014-08-07 | Disposition: A | Discharge status: Home / Self Care | Payer: Medicaid Other | Source: Ambulatory Visit | Attending: Obstetrics & Gynecology | Admitting: Obstetrics & Gynecology

## 2014-08-07 DIAGNOSIS — J312 Chronic pharyngitis: Secondary | ICD-10-CM

## 2014-08-07 DIAGNOSIS — R0982 Postnasal drip: Secondary | ICD-10-CM

## 2014-08-07 DIAGNOSIS — J029 Acute pharyngitis, unspecified: Secondary | ICD-10-CM | POA: Insufficient documentation

## 2014-08-07 LAB — RAPID STREP SCREEN (MED CTR MEBANE ONLY): Streptococcus, Group A Screen (Direct): NEGATIVE

## 2014-08-07 MED ORDER — FLUTICASONE PROPIONATE 50 MCG/ACT NA SUSP: 2.0000 | Freq: Every day | NASAL | Status: DC

## 2014-08-07 NOTE — MAU Note (Signed)
Urine in lab 

## 2014-08-07 NOTE — MAU Note (Signed)
Throat hurts really bad, started yesterday. Feels swollen, hard to swallow.Denies fever.no cough

## 2014-08-07 NOTE — Discharge Instructions (Signed)

## 2014-08-07 NOTE — MAU Provider Note (Signed)
History     CSN: [id number]  Arrival date and time: 08/07/14 1232   None     Chief Complaint  Patient presents with  . Sore Throat   HPI This is a 29 y.o. female who presents with c/o sore throat for a year. States it comes and goes, got more sore yesterday.  States has had this off and on for over a year. Has never been seen for it.  Denies fever  RN Note  Expand All Collapse All   Throat hurts really bad, started yesterday. Feels swollen, hard to swallow.Denies fever.no cough          OB History    Gravida Para Term Preterm AB TAB SAB Ectopic Multiple Living   3 1 1  0 1 0 1 0 0 1      Past Medical History  Diagnosis Date  . No pertinent past medical history   . Medical history non-contributory     Past Surgical History  Procedure Laterality Date  . Dilation and curettage of uterus      No family history on file.  History  Substance Use Topics  . Smoking status: Never Smoker   . Smokeless tobacco: Not on file  . Alcohol Use: No    Allergies: No Known Allergies  Prescriptions prior to admission  Medication Sig Dispense Refill Last Dose  . acetaminophen (TYLENOL) 500 MG tablet Take 1,000-2,000 mg by mouth every 6 (six) hours as needed. For pain   07/09/2012 at Unknown  . penicillin v potassium (VEETID) 500 MG tablet Take 1 tablet (500 mg total) by mouth 4 (four) times daily. 40 tablet 0   . PERCOCET 5-325 MG per tablet Take 1 tablet by mouth every 6 (six) hours as needed for pain. 15 tablet 0     Review of Systems  Constitutional: Negative for fever, chills and malaise/fatigue.  HENT: Positive for sore throat.   Respiratory: Negative for cough, sputum production, shortness of breath and wheezing.   Cardiovascular: Negative for chest pain.  Gastrointestinal: Negative for nausea, vomiting and abdominal pain.  Neurological: Negative for weakness and headaches.   Physical Exam   Blood pressure 161/97, pulse 98, temperature 99.3 F (37.4 C),  temperature source Oral, resp. rate 18, weight 159 lb (72.122 kg), last menstrual period 07/27/2014, SpO2 100 %.  Physical Exam  Constitutional: She is oriented to person, place, and time. She appears well-developed and well-nourished. No distress.  HENT:  Head: Normocephalic.  Mouth/Throat: Oropharynx is clear and moist and mucous membranes are normal. No oropharyngeal exudate (Difficult for me to see tonsils. mild erethema noted posterior pharynx, mildly enlarged LN under jaws).  Cardiovascular: Normal rate and regular rhythm.  Exam reveals no gallop and no friction rub.   No murmur heard. Respiratory: Effort normal and breath sounds normal.  GI: Soft. There is no tenderness. There is no rebound and no guarding.  Musculoskeletal: Normal range of motion.  Neurological: She is alert and oriented to person, place, and time.  Skin: Skin is warm and dry.  Psychiatric: She has a normal mood and affect.    MAU Course  Procedures  MDM Strep culture sent Consulted Dr Erin FullingHarraway-Smith, who thinks this is postnasal drip. Advises Rx Flonase  Assessment and Plan  A:  Sore throat      Probable postnasal drip  P;  Discussed with patient recommendations by Dr Erin FullingHarraway-Smith       Advised to followup with family doctor  Rx flonase       Tylenol for pain   [facility] 08/07/2014, 2:08 PM   Streptococcus, Group A Screen (Direct) NEGATIVE  NEGATIVE

## 2014-08-08 ENCOUNTER — Encounter (HOSPITAL_COMMUNITY): Payer: Self-pay

## 2014-08-08 ENCOUNTER — Emergency Department (HOSPITAL_COMMUNITY)
Admission: EM | Admit: 2014-08-08 | Discharge: 2014-08-08 | Disposition: A | Discharge status: Home / Self Care | Payer: Medicaid Other | Attending: Emergency Medicine | Admitting: Emergency Medicine

## 2014-08-08 ENCOUNTER — Emergency Department (HOSPITAL_COMMUNITY): Payer: Medicaid Other

## 2014-08-08 DIAGNOSIS — Z79899 Other long term (current) drug therapy: Secondary | ICD-10-CM | POA: Diagnosis not present

## 2014-08-08 DIAGNOSIS — Z7951 Long term (current) use of inhaled steroids: Secondary | ICD-10-CM | POA: Insufficient documentation

## 2014-08-08 DIAGNOSIS — J029 Acute pharyngitis, unspecified: Secondary | ICD-10-CM | POA: Diagnosis present

## 2014-08-08 DIAGNOSIS — J039 Acute tonsillitis, unspecified: Secondary | ICD-10-CM | POA: Diagnosis not present

## 2014-08-08 LAB — CBC WITH DIFFERENTIAL/PLATELET
BASOS ABS: 0 10*3/uL (ref 0.0–0.1)
BASOS PCT: 0 % (ref 0–1)
EOS ABS: 0 10*3/uL (ref 0.0–0.7)
EOS PCT: 1 % (ref 0–5)
HEMATOCRIT: 41 % (ref 36.0–46.0)
Hemoglobin: 14.4 g/dL (ref 12.0–15.0)
LYMPHS ABS: 1.3 10*3/uL (ref 0.7–4.0)
Lymphocytes Relative: 16 % (ref 12–46)
MCH: 31.6 pg (ref 26.0–34.0)
MCHC: 35.1 g/dL (ref 30.0–36.0)
MCV: 89.9 fL (ref 78.0–100.0)
Monocytes Absolute: 0.9 10*3/uL (ref 0.1–1.0)
Monocytes Relative: 11 % (ref 3–12)
NEUTROS ABS: 6 10*3/uL (ref 1.7–7.7)
NEUTROS PCT: 72 % (ref 43–77)
PLATELETS: 215 10*3/uL (ref 150–400)
RBC: 4.56 MIL/uL (ref 3.87–5.11)
RDW: 12.5 % (ref 11.5–15.5)
WBC: 8.1 10*3/uL (ref 4.0–10.5)

## 2014-08-08 LAB — I-STAT CHEM 8, ED
BUN: 5 mg/dL — AB (ref 6–23)
Calcium, Ion: 1.11 mmol/L — ABNORMAL LOW (ref 1.12–1.23)
Chloride: 99 mmol/L (ref 96–112)
Creatinine, Ser: 0.6 mg/dL (ref 0.50–1.10)
Glucose, Bld: 83 mg/dL (ref 70–99)
HEMATOCRIT: 44 % (ref 36.0–46.0)
HEMOGLOBIN: 15 g/dL (ref 12.0–15.0)
POTASSIUM: 3.9 mmol/L (ref 3.5–5.1)
SODIUM: 137 mmol/L (ref 135–145)
TCO2: 24 mmol/L (ref 0–100)

## 2014-08-08 MED ORDER — SODIUM CHLORIDE 0.9 % IV BOLUS (SEPSIS)
1000.0000 mL | Freq: Once | INTRAVENOUS | Status: AC
  Administered 2014-08-08: 1000 mL via INTRAVENOUS

## 2014-08-08 MED ORDER — HYDROCODONE-ACETAMINOPHEN 7.5-325 MG/15ML PO SOLN: 15.0000 mL | ORAL | Status: DC | PRN

## 2014-08-08 MED ORDER — DEXAMETHASONE SODIUM PHOSPHATE 10 MG/ML IJ SOLN
10.0000 mg | Freq: Once | INTRAMUSCULAR | Status: AC
  Administered 2014-08-08: 10 mg via INTRAVENOUS
  Filled 2014-08-08: qty 1

## 2014-08-08 MED ORDER — DEXTROSE 5 % IV SOLN: 5.0000 mg/h | Freq: Once | INTRAVENOUS | Status: DC

## 2014-08-08 MED ORDER — IOHEXOL 300 MG/ML  SOLN
80.0000 mL | Freq: Once | INTRAMUSCULAR | Status: AC | PRN
  Administered 2014-08-08: 75 mL via INTRAVENOUS

## 2014-08-08 NOTE — ED Notes (Signed)
Pt presents with 2 day h/o difficulty swallowing.  Pt reports "barely" able to handle secretions, has muffled voice.

## 2014-08-08 NOTE — ED Notes (Signed)
Pt returned to room from CT, no distress noted 

## 2014-08-08 NOTE — ED Provider Notes (Signed)
CSN: [id number]     Arrival date & time 08/08/14  1144 History   First MD Initiated Contact with Patient 08/08/14 1152     Chief Complaint  Patient presents with  . Sore Throat     (Consider location/radiation/quality/duration/timing/severity/associated sxs/prior Treatment) HPI Desiree Daniel is a 29 y.o. female with no medical problems, presents to emergency department complaining of sore throat. Patient states she developed swelling to the right side of the neck and sore throat approximately 2 days ago. States it is very difficult for her to swallow both liquids, and she is unable to swallow solids. [facility], states she had negative strep screen, she was discharged with over-the-counter medications. She states she has tried taking Tylenol for this with no relief of her symptoms. She states pain is mainly on the right side. She denies any fever or chills. She states she has had history of similar pains in the past. States it usually comes and goes away on its own, however this time the pain is more severe and she is having more trouble swallowing. She denies any nasal congestion. No cough. No neck stiffness. No headache. States she has not eaten anything in 2 days.  Past Medical History  Diagnosis Date  . No pertinent past medical history   . Medical history non-contributory    Past Surgical History  Procedure Laterality Date  . Dilation and curettage of uterus     History reviewed. No pertinent family history. History  Substance Use Topics  . Smoking status: Never Smoker   . Smokeless tobacco: Not on file  . Alcohol Use: No   OB History    Gravida Para Term Preterm AB TAB SAB Ectopic Multiple Living   3 1 1  0 1 0 1 0 0 1     Review of Systems  Constitutional: Negative for fever and chills.  HENT: Positive for sore throat, trouble swallowing and voice change. Negative for congestion and ear pain.   Respiratory: Negative for cough, chest  tightness and shortness of breath.   Cardiovascular: Negative for chest pain, palpitations and leg swelling.  Gastrointestinal: Negative for nausea, vomiting, abdominal pain and diarrhea.  Musculoskeletal: Positive for neck pain. Negative for myalgias, arthralgias and neck stiffness.  Skin: Negative for rash.  Neurological: Negative for dizziness, weakness and headaches.  All other systems reviewed and are negative.     Allergies  Review of patient's allergies indicates no known allergies.  Home Medications   Prior to Admission medications   Medication Sig Start Date End Date Taking? Authorizing Provider  acetaminophen (TYLENOL) 500 MG tablet Take 1,000-2,000 mg by mouth every 6 (six) hours as needed. For pain   Yes Historical Provider, MD  fluticasone (FLONASE) 50 MCG/ACT nasal spray Place 2 sprays into both nostrils daily. 08/07/14  Yes Aviva SignsMarie L Williams, CNM  levonorgestrel (MIRENA) 20 MCG/24HR IUD 1 each by Intrauterine route once.   Yes Historical Provider, MD   BP 150/111 mmHg  Pulse 89  Temp(Src) 99 F (37.2 C) (Oral)  Resp 18  Ht 5\' 5"  (1.651 m)  Wt 157 lb (71.215 kg)  BMI 26.13 kg/m2  SpO2 100%  LMP 07/26/2014 Physical Exam  Constitutional: She appears well-developed and well-nourished. No distress.  HENT:  Head: Normocephalic.  Tonsils bilaterally enlarged, right greater than left. Uvula midline. Mild exudate  Eyes: Conjunctivae and EOM are normal. Pupils are equal, round, and reactive to light.  Neck: Normal range of motion. Neck supple.  Tender anterior  cervical lymphadenopathy, right greater than left  Cardiovascular: Normal rate, regular rhythm and normal heart sounds.   Pulmonary/Chest: Effort normal and breath sounds normal. No respiratory distress. She has no wheezes. She has no rales.  Abdominal: Soft. Bowel sounds are normal. She exhibits no distension. There is no tenderness. There is no rebound.  Musculoskeletal: She exhibits no edema.  Neurological: She  is alert.  Skin: Skin is warm and dry.  Psychiatric: She has a normal mood and affect. Her behavior is normal.  Nursing note and vitals reviewed.   ED Course  Procedures (including critical care time) Labs Review Labs Reviewed  I-STAT CHEM 8, ED - Abnormal; Notable for the following:    BUN 5 (*)    Calcium, Ion 1.11 (*)    All other components within normal limits  CBC WITH DIFFERENTIAL/PLATELET    Imaging Review Ct Soft Tissue Neck W Contrast  08/08/2014   CLINICAL DATA:  Sore throat.  Difficulty swallowing.  EXAM: CT NECK WITH CONTRAST  TECHNIQUE: Multidetector CT imaging of the neck was performed using the standard protocol following the bolus administration of intravenous contrast.  CONTRAST:  75mL OMNIPAQUE IOHEXOL 300 MG/ML  SOLN  COMPARISON:  None.  FINDINGS: Pharynx and larynx: There is hypertrophy of the tonsils bilaterally. Asymmetric low attenuation within the right tonsil is noted measuring 1 cm in may represent early phlegmon formation. No discrete abscess identified.  Salivary glands: Symmetric appearance of the parotid and submandibular glands.  Thyroid: The thyroid gland appears normal.  Lymph nodes: Prominent bilateral cervical lymph nodes are identified. Left level 2 node measures 1 cm in short axis, image 39/series 2. There is a right level 2 node which has a short axis dimension of 1.4 cm, image 40/series 2.  Vascular: Negative.  Limited intracranial: Negative.  Visualized orbits: Negative.  Mastoids and visualized paranasal sinuses: The paranasal sinuses and mastoid air cells are clear.  Skeleton: The visualized skeletal structures are unremarkable.  Upper chest: The lung apices are clear.  IMPRESSION: 1. Bilateral tonsillar hypertrophy compatible with tonsillitis with reactive adenopathy. 2. No retropharyngeal or peritonsillar abscess identified.   Electronically Signed   By: Signa Kell M.D.   On: 08/08/2014 14:07     EKG Interpretation None      MDM   Final  diagnoses:  Tonsillitis    Patient with bilateral enlarged tonsils with exudate, anterior cervical lymphadenopathy, difficulty swallowing although she is able to swallow liquids. She is afebrile, nontoxic appearing. Pain is mainly on the right side with swelling visible. Will get CT of the soft tissue neck to rule out peritonsillar abscess.   2:48 PM Patient's lab work is unremarkable. Her CT of the neck is consistent with tonsillitis. Her white count is normal. She is afebrile. Most likely viral tonsillitis. Cultures for strep obtained yesterday and are pending. We'll discharge her home with pain medications, continue salt water gargles, continue fluids, she will be contacted if strep culture returns positive. She is instructed to return if symptoms are worsening.  Filed Vitals:   08/08/14 1200 08/08/14 1300 08/08/14 1355 08/08/14 1400  BP: 150/111 139/96 139/87 141/91  Pulse: 89 79 83 85  Temp:   98.9 F (37.2 C)   TempSrc:   Oral   Resp:   16   Height:      Weight:      SpO2: 100% 100% 100% 100%       Lottie Mussel, PA-C 08/08/14 1449  Raeford Razor, MD 08/09/14 [phone]

## 2014-08-08 NOTE — Discharge Instructions (Signed)
Continue salt water gargles. Motrin for pain. Take hycet for severe pain only. Rest. Drink plenty of fluids. Your CT scan is consistent with tonsillitis. Most cases are viral. Your cultures were sent, and if return positive you will be called. Return if symptoms are worsening.   Tonsillitis Tonsillitis is an infection of the throat that causes the tonsils to become red, tender, and swollen. Tonsils are collections of lymphoid tissue at the back of the throat. Each tonsil has crevices (crypts). Tonsils help fight nose and throat infections and keep infection from spreading to other parts of the body for the first 18 months of life.  CAUSES Sudden (acute) tonsillitis is usually caused by infection with streptococcal bacteria. Long-lasting (chronic) tonsillitis occurs when the crypts of the tonsils become filled with pieces of food and bacteria, which makes it easy for the tonsils to become repeatedly infected. SYMPTOMS  Symptoms of tonsillitis include:  A sore throat, with possible difficulty swallowing.  White patches on the tonsils.  Fever.  Tiredness.  New episodes of snoring during sleep, when you did not snore before.  Small, foul-smelling, yellowish-white pieces of material (tonsilloliths) that you occasionally cough up or spit out. The tonsilloliths can also cause you to have bad breath. DIAGNOSIS Tonsillitis can be diagnosed through a physical exam. Diagnosis can be confirmed with the results of lab tests, including a throat culture. TREATMENT  The goals of tonsillitis treatment include the reduction of the severity and duration of symptoms and prevention of associated conditions. Symptoms of tonsillitis can be improved with the use of steroids to reduce the swelling. Tonsillitis caused by bacteria can be treated with antibiotic medicines. Usually, treatment with antibiotic medicines is started before the cause of the tonsillitis is known. However, if it is determined that the cause is  not bacterial, antibiotic medicines will not treat the tonsillitis. If attacks of tonsillitis are severe and frequent, your health care provider may recommend surgery to remove the tonsils (tonsillectomy). HOME CARE INSTRUCTIONS   Rest as much as possible and get plenty of sleep.  Drink plenty of fluids. While the throat is very sore, eat soft foods or liquids, such as sherbet, soups, or instant breakfast drinks.  Eat frozen ice pops.  Gargle with a warm or cold liquid to help soothe the throat. Mix 1/4 teaspoon of salt and 1/4 teaspoon of baking soda in 8 oz of water. SEEK MEDICAL CARE IF:   Large, tender lumps develop in your neck.  A rash develops.  A green, yellow-brown, or bloody substance is coughed up.  You are unable to swallow liquids or food for 24 hours.  You notice that only one of the tonsils is swollen. SEEK IMMEDIATE MEDICAL CARE IF:   You develop any new symptoms such as vomiting, severe headache, stiff neck, chest pain, or trouble breathing or swallowing.  You have severe throat pain along with drooling or voice changes.  You have severe pain, unrelieved with recommended medications.  You are unable to fully open the mouth.  You develop redness, swelling, or severe pain anywhere in the neck.  You have a fever. MAKE SURE YOU:   Understand these instructions.  Will watch your condition.  Will get help right away if you are not doing well or get worse. Document Released: 03/16/2005 Document Revised: 10/21/2013 Document Reviewed: 11/23/2012 [facility] Patient Information 2015 [employer] [place]. This information is not intended to replace advice given to you by your health care provider. Make sure you discuss any questions you have  with your health care provider. ° °

## 2014-08-09 LAB — CULTURE, GROUP A STREP

## 2014-08-26 ENCOUNTER — Encounter (HOSPITAL_COMMUNITY): Payer: Self-pay

## 2014-08-26 ENCOUNTER — Emergency Department (HOSPITAL_COMMUNITY)
Admission: EM | Admit: 2014-08-26 | Discharge: 2014-08-26 | Disposition: A | Discharge status: Home / Self Care | Payer: Medicaid Other | Attending: Emergency Medicine | Admitting: Emergency Medicine

## 2014-08-26 DIAGNOSIS — A491 Streptococcal infection, unspecified site: Secondary | ICD-10-CM

## 2014-08-26 DIAGNOSIS — J069 Acute upper respiratory infection, unspecified: Secondary | ICD-10-CM | POA: Diagnosis present

## 2014-08-26 DIAGNOSIS — J020 Streptococcal pharyngitis: Secondary | ICD-10-CM | POA: Insufficient documentation

## 2014-08-26 DIAGNOSIS — Z7951 Long term (current) use of inhaled steroids: Secondary | ICD-10-CM | POA: Diagnosis not present

## 2014-08-26 LAB — RAPID STREP SCREEN (MED CTR MEBANE ONLY): Streptococcus, Group A Screen (Direct): NEGATIVE

## 2014-08-26 MED ORDER — ACETAMINOPHEN-CODEINE 120-12 MG/5ML PO SOLN: 10.0000 mL | ORAL | Status: DC | PRN

## 2014-08-26 MED ORDER — CLINDAMYCIN HCL 300 MG PO CAPS: 300.0000 mg | ORAL_CAPSULE | Freq: Four times a day (QID) | ORAL | Status: DC

## 2014-08-26 NOTE — ED Provider Notes (Signed)
CSN: [id number]     Arrival date & time 08/26/14  1046 History  This chart was scribed for non-physician practitioner, Ebbie Ridge PA-C,working with Doug Sou, MD, by Lionel December, ED Scribe. This patient was seen in room TR07C/TR07C and the patient's care was started at 12:30 AM.   First MD Initiated Contact with Patient 08/26/14 1120     No chief complaint on file.    (Consider location/radiation/quality/duration/timing/severity/associated sxs/prior Treatment) The history is provided by the patient. No language interpreter was used.    HPI Comments: Desiree Daniel is a 29 y.o. female who presents to the Emergency Department complaining of a constant sore throat onset 1 week ago.  Patient notes both sides of her throat are in pain. Patient denies nausea/ vomiting, fever/chills or difficulty swallowing.  She has a history of sore throats and was here last month for similar symptoms.  Patient has no other complaints today.       Past Medical History  Diagnosis Date  . No pertinent past medical history   . Medical history non-contributory    Past Surgical History  Procedure Laterality Date  . Dilation and curettage of uterus     No family history on file. History  Substance Use Topics  . Smoking status: Never Smoker   . Smokeless tobacco: Not on file  . Alcohol Use: No   OB History    Gravida Para Term Preterm AB TAB SAB Ectopic Multiple Living   0 1 0 1 0 0 1     Review of Systems  Constitutional: Negative for fever and fatigue.  HENT: Positive for sore throat.   Gastrointestinal: Negative for nausea, vomiting and diarrhea.      Allergies  Review of patient's allergies indicates no known allergies.  Home Medications   Prior to Admission medications   Medication Sig Start Date End Date Taking? Authorizing Provider  acetaminophen (TYLENOL) 500 MG tablet Take 1,000-2,000 mg by mouth every 6 (six) hours as needed. For pain    Historical Provider, MD   fluticasone (FLONASE) 50 MCG/ACT nasal spray Place 2 sprays into both nostrils daily. 08/07/14   Aviva Signs, CNM  HYDROcodone-acetaminophen (HYCET) 7.5-325 mg/15 ml solution Take 15 mLs by mouth every 4 (four) hours as needed for moderate pain or severe pain. 08/08/14   Jaynie Crumble, PA-C  levonorgestrel (MIRENA) 20 MCG/24HR IUD 1 each by Intrauterine route once.    Historical Provider, MD   LMP 07/26/2014 Physical Exam  Constitutional: She is oriented to person, place, and time. She appears well-developed and well-nourished. No distress.  HENT:  Head: Normocephalic and atraumatic.  Mouth/Throat: Oropharynx is clear and moist.  Eyes: Conjunctivae are normal. Pupils are equal, round, and reactive to light.  Neck: Normal range of motion. Neck supple.  Cardiovascular: Normal rate, regular rhythm and normal heart sounds.  Exam reveals no gallop and no friction rub.   No murmur heard. Pulmonary/Chest: Effort normal and breath sounds normal. No respiratory distress.  Musculoskeletal: Normal range of motion.  Lymphadenopathy:    She has no cervical adenopathy.  Neurological: She is alert and oriented to person, place, and time.  Skin: Skin is warm and dry.  Psychiatric: She has a normal mood and affect. Her behavior is normal.  Nursing note and vitals reviewed.   ED Course  Procedures (including critical care time) DIAGNOSTIC STUDIES: Oxygen Saturation is 100% on RA, normal by my interpretation.    COORDINATION OF CARE: 12:31 PM Discussed treatment plan  with patient at beside, the patient agrees with the plan and has no further questions at this time.   I personally performed the services described in this documentation, which was scribed in my presence. The recorded information has been reviewed and is accurate.   Charlestine NightChristopher Nelissa Bolduc, PA-C 08/26/14 1607  Doug SouSam Jacubowitz, MD 08/26/14 [phone]

## 2014-08-26 NOTE — ED Notes (Addendum)
Pt reports sore throat x 1 week.  No cold/cough/fever or known exposure to strep.  No resp or swallowing difficulties. Pt reports h/o sore throats.  Seen 08-07-14 and 08-08-14 for same.  Symptoms resolved and returned 1 week ago.

## 2014-08-26 NOTE — Discharge Instructions (Signed)
Return here as needed. Follow up with the ENT doctor as needed.

## 2014-08-26 NOTE — ED Notes (Signed)
Rapid strept screen performed by RN

## 2014-08-29 ENCOUNTER — Emergency Department (HOSPITAL_COMMUNITY): Payer: Medicaid Other

## 2014-08-29 ENCOUNTER — Emergency Department (HOSPITAL_COMMUNITY)
Admission: EM | Admit: 2014-08-29 | Discharge: 2014-08-30 | Disposition: A | Discharge status: Home / Self Care | Payer: Medicaid Other | Attending: Emergency Medicine | Admitting: Emergency Medicine

## 2014-08-29 ENCOUNTER — Encounter (HOSPITAL_COMMUNITY): Payer: Self-pay | Admitting: Cardiology

## 2014-08-29 DIAGNOSIS — J351 Hypertrophy of tonsils: Secondary | ICD-10-CM | POA: Insufficient documentation

## 2014-08-29 DIAGNOSIS — R000 Tachycardia, unspecified: Secondary | ICD-10-CM | POA: Insufficient documentation

## 2014-08-29 DIAGNOSIS — J039 Acute tonsillitis, unspecified: Secondary | ICD-10-CM | POA: Diagnosis not present

## 2014-08-29 DIAGNOSIS — Z7951 Long term (current) use of inhaled steroids: Secondary | ICD-10-CM | POA: Insufficient documentation

## 2014-08-29 DIAGNOSIS — J029 Acute pharyngitis, unspecified: Secondary | ICD-10-CM | POA: Diagnosis present

## 2014-08-29 LAB — CBC WITH DIFFERENTIAL/PLATELET
Basophils Absolute: 0 10*3/uL (ref 0.0–0.1)
Basophils Relative: 0 % (ref 0–1)
EOS PCT: 0 % (ref 0–5)
Eosinophils Absolute: 0 10*3/uL (ref 0.0–0.7)
HCT: 37.8 % (ref 36.0–46.0)
Hemoglobin: 13.4 g/dL (ref 12.0–15.0)
LYMPHS ABS: 0.7 10*3/uL (ref 0.7–4.0)
LYMPHS PCT: 5 % — AB (ref 12–46)
MCH: 30.7 pg (ref 26.0–34.0)
MCHC: 35.4 g/dL (ref 30.0–36.0)
MCV: 86.7 fL (ref 78.0–100.0)
Monocytes Absolute: 0.3 10*3/uL (ref 0.1–1.0)
Monocytes Relative: 2 % — ABNORMAL LOW (ref 3–12)
NEUTROS PCT: 93 % — AB (ref 43–77)
Neutro Abs: 11.9 10*3/uL — ABNORMAL HIGH (ref 1.7–7.7)
PLATELETS: 275 10*3/uL (ref 150–400)
RBC: 4.36 MIL/uL (ref 3.87–5.11)
RDW: 12.1 % (ref 11.5–15.5)
WBC: 12.9 10*3/uL — AB (ref 4.0–10.5)

## 2014-08-29 LAB — MONONUCLEOSIS SCREEN: MONO SCREEN: NEGATIVE

## 2014-08-29 MED ORDER — DEXAMETHASONE SODIUM PHOSPHATE 10 MG/ML IJ SOLN
6.0000 mg | Freq: Once | INTRAMUSCULAR | Status: AC
  Administered 2014-08-29: 6 mg via INTRAMUSCULAR
  Filled 2014-08-29: qty 1

## 2014-08-29 MED ORDER — CLINDAMYCIN PHOSPHATE 900 MG/50ML IV SOLN
900.0000 mg | Freq: Once | INTRAVENOUS | Status: AC
  Administered 2014-08-29: 900 mg via INTRAVENOUS
  Filled 2014-08-29: qty 50

## 2014-08-29 MED ORDER — CLINDAMYCIN HCL 150 MG PO CAPS: 300.0000 mg | ORAL_CAPSULE | Freq: Four times a day (QID) | ORAL | Status: DC

## 2014-08-29 MED ORDER — HYDROCODONE-ACETAMINOPHEN 5-325 MG PO TABS: 1.0000 | ORAL_TABLET | ORAL | Status: DC | PRN

## 2014-08-29 MED ORDER — IOHEXOL 300 MG/ML  SOLN
75.0000 mL | Freq: Once | INTRAMUSCULAR | Status: AC | PRN
  Administered 2014-08-29: 75 mL via INTRAVENOUS

## 2014-08-29 MED ORDER — DEXAMETHASONE SODIUM PHOSPHATE 10 MG/ML IJ SOLN
10.0000 mg | Freq: Once | INTRAMUSCULAR | Status: AC
  Administered 2014-08-29: 10 mg via INTRAVENOUS
  Filled 2014-08-29: qty 1

## 2014-08-29 MED ORDER — SODIUM CHLORIDE 0.9 % IV BOLUS (SEPSIS)
1000.0000 mL | Freq: Once | INTRAVENOUS | Status: AC
  Administered 2014-08-29: 1000 mL via INTRAVENOUS

## 2014-08-29 MED ORDER — PENICILLIN G BENZATHINE 1200000 UNIT/2ML IM SUSP
1.2000 10*6.[IU] | Freq: Once | INTRAMUSCULAR | Status: AC
  Administered 2014-08-29: 1.2 10*6.[IU] via INTRAMUSCULAR
  Filled 2014-08-29: qty 2

## 2014-08-29 NOTE — ED Notes (Signed)
Pt reports a sore throat since feb. States that she has been on antibiotics but is not getting any better. Pt reports pain with swallowing. Airway intact

## 2014-08-29 NOTE — ED Provider Notes (Signed)
CSN: [id number]     Arrival date & time 08/29/14  1326 History  This chart was scribed for non-physician practitioner, Will Layal Javid working with No att. providers found by Angelene Giovanni, ED Scribe. The patient was seen in room TR04C/TR04C and the patient's care was started at 4:27 PM     Chief Complaint  Patient presents with  . Sore Throat   The history is provided by the patient. No language interpreter was used.   HPI Comments: Desiree Daniel is a 29 y.o. female who presents to the Emergency Department complaining of a gradually worsening sore throat. This has been ongoing for one month. She reports associated trouble swallowing or drinking and a hoarse voice. She denies any fevers, N/V/D, abdominal pain, cough, body aches, and eye or ear pain. She reports that she is unable to swallow her Clindamycin medication today which she was given 3 days ago. Pt was also seen on 08/08/14 for the same symptoms and given antibiotics with no relief. She denies any allergy to penicillin.     Past Medical History  Diagnosis Date  . No pertinent past medical history   . Medical history non-contributory    Past Surgical History  Procedure Laterality Date  . Dilation and curettage of uterus     History reviewed. No pertinent family history. History  Substance Use Topics  . Smoking status: Never Smoker   . Smokeless tobacco: Not on file  . Alcohol Use: Yes     Comment: occasional   OB History    Gravida Para Term Preterm AB TAB SAB Ectopic Multiple Living   0 1 0 1 0 0 1     Review of Systems  Constitutional: Negative for fever.  HENT: Positive for sore throat, trouble swallowing and voice change. Negative for ear pain and rhinorrhea.   Eyes: Negative for pain.  Respiratory: Negative for cough.   Gastrointestinal: Negative for nausea, vomiting, abdominal pain and diarrhea.  Musculoskeletal: Negative for myalgias.  Skin: Negative for rash and wound.  Neurological: Negative for  dizziness, light-headedness and headaches.      Allergies  Review of patient's allergies indicates no known allergies.  Home Medications   Prior to Admission medications   Medication Sig Start Date End Date Taking? Authorizing Provider  acetaminophen (TYLENOL) 500 MG tablet Take 1,000-2,000 mg by mouth every 6 (six) hours as needed. For pain   Yes Historical Provider, MD  acetaminophen-codeine 120-12 MG/5ML solution Take 10 mLs by mouth every 4 (four) hours as needed for moderate pain. 08/26/14  Yes Charlestine Night, PA-C  levonorgestrel (MIRENA) 20 MCG/24HR IUD 1 each by Intrauterine route once.   Yes Historical Provider, MD  clindamycin (CLEOCIN) 150 MG capsule Take 2 capsules (300 mg total) by mouth 4 (four) times daily. May dispense as  capsules 08/29/14   Everlene Farrier, PA-C  fluticasone [facility]) 50 MCG/ACT nasal spray Place 2 sprays into both nostrils daily. Patient not taking: Reported on 08/29/2014 08/07/14   Aviva Signs, CNM  HYDROcodone-acetaminophen (NORCO/VICODIN) 5-325 MG per tablet Take 1-2 tablets by mouth every 4 (four) hours as needed. 08/29/14   Everlene Farrier, PA-C   BP 109/70 mmHg  Pulse 77  Temp(Src) 97.8 F (36.6 C) (Oral)  Resp 20  Ht  (1.626 m)  Wt 157 lb (71.215 kg)  BMI 26.94 kg/m2  SpO2 100%  LMP 08/25/2014 Physical Exam  Constitutional: She is oriented to person, place, and time. She appears well-developed and well-nourished. No distress.  HENT:  Head: Normocephalic and atraumatic.  Right Ear: Tympanic membrane and external ear normal. Tympanic membrane is not erythematous.  Left Ear: Tympanic membrane and external ear normal. Tympanic membrane is not erythematous.  Nose: Nose normal.  Mouth/Throat: No uvula swelling. No oropharyngeal exudate or tonsillar abscesses.  Bilateral tonsillar hypertrophy with left greater than right. Possibly concerning for tonsillar abscess.  Uvula is midline without edema. Bilateral tympanic membranes are  pearly-gray without erythema or loss of landmarks. No facial swelling.   Eyes: Conjunctivae and EOM are normal. Pupils are equal, round, and reactive to light. Right eye exhibits no discharge. Left eye exhibits no discharge.  Neck: Normal range of motion. Neck supple. No JVD present. No tracheal deviation present.  Left sided submandibular lymphopathy.   Cardiovascular: Normal rate, regular rhythm, normal heart sounds and intact distal pulses.  Exam reveals no gallop and no friction rub.   No murmur heard. Tachycardia   Pulmonary/Chest: Effort normal and breath sounds normal. No respiratory distress. She has no wheezes. She has no rales.  Abdominal: Soft. Bowel sounds are normal. She exhibits no distension. There is no tenderness.  Musculoskeletal: Normal range of motion.  Lymphadenopathy:    She has no cervical adenopathy.  Neurological: She is alert and oriented to person, place, and time. No cranial nerve deficit. Coordination normal.  Cranial nerves intact. Sensation intact in her bilateral face.   Skin: Skin is warm and dry. No rash noted. She is not diaphoretic. No erythema. No pallor.  Psychiatric: She has a normal mood and affect. Her behavior is normal.  Nursing note and vitals reviewed.   ED Course  Procedures (including critical care time) DIAGNOSTIC STUDIES: Oxygen Saturation is 97% on RA, adequate by my interpretation.    COORDINATION OF CARE: 4:32 PM- Pt advised of plan for treatment and pt agrees.    Labs Review Labs Reviewed  CBC WITH DIFFERENTIAL/PLATELET - Abnormal; Notable for the following:    WBC 12.9 (*)    Neutrophils Relative % 93 (*)    Neutro Abs 11.9 (*)    Lymphocytes Relative 5 (*)    Monocytes Relative 2 (*)    All other components within normal limits  MONONUCLEOSIS SCREEN    Imaging Review Ct Soft Tissue Neck W Contrast  08/29/2014   CLINICAL DATA:  Gradual worsening of sore throat for 1 month with difficulty swallowing. Tonsillar hypertrophy,  question peritonsillar abscess.  EXAM: CT NECK WITH CONTRAST  TECHNIQUE: Multidetector CT imaging of the neck was performed using the standard protocol following the bolus administration of intravenous contrast.  CONTRAST:  75mL OMNIPAQUE IOHEXOL 300 MG/ML  SOLN  COMPARISON:  CT neck 08/08/2014  FINDINGS: Pharynx and larynx: Tonsillar hypertrophy is again seen, this is progressed involving the left palatine tonsil, is improved involving the right palatine tonsil. The previous sub cm low-density within the right tonsil has resolved. There is developing ill-defined hypodensity within the left tonsil measuring 2.0 x 11 cm that may reflect phlegmon, no well-defined fluid collection or abscess. No peritonsillar fluid collection.  Salivary glands: Homogeneous enhancement without focal mass.  Thyroid: Normal, no nodule noted.  Lymph nodes: Prominent bilateral cervical lymph nodes are again seen. There is overall improvement in the right and progression on the left. Level to lymph node on the left measures 1.4 cm, previously 1.0 cm. On the right this measures 1.0 cm, previously 1.4 cm.  Vascular: Normal.  Limited intracranial: Normal.  Visualized orbits: Normal.  Mastoids and visualized paranasal sinuses: Trace mucosal  thickening bilaterally. Periapical lucencies about the right and left upper molars may reflect dental caries.  Skeleton: Normal.  Upper chest: Lung apices are clear.  IMPRESSION: 1. Tonsillar hypertrophy, improved on the right, however worsened on the left. Ill-defined hypodensity in the left tonsil measuring 2.0 x 1.1 cm consistent with phlegmon, a well-defined fluid collection is not definitively seen. 2. Reactive adenopathy, also improved in the right and worsened on the left.   Electronically Signed   By: Rubye OaksMelanie  Ehinger M.D.   On: 08/29/2014 22:29     EKG Interpretation None      Filed Vitals:   08/29/14 1355 08/29/14 1841 08/29/14 2049 08/29/14 2323  BP: 135/90 122/75 117/74 109/70  Pulse:  110 98 85 77  Temp: 100 F (37.8 C)  97.9 F (36.6 C) 97.8 F (36.6 C)  TempSrc: Oral  Oral Oral  Resp: 18 16 20 20   Height: 5\' 4"  (1.626 m)     Weight: 157 lb (71.215 kg)     SpO2: 97% 100% 100% 100%     MDM   Meds given in ED:  Medications  sodium chloride 0.9 % bolus 1,000 mL (0 mLs Intravenous Stopped 08/29/14 1842)  penicillin g benzathine (BICILLIN LA) 1200000 UNIT/2ML injection 1.2 Million Units (1.2 Million Units Intramuscular Given 08/29/14 1652)  dexamethasone (DECADRON) injection 6 mg (6 mg Intramuscular Given 08/29/14 1651)  iohexol (OMNIPAQUE) 300 MG/ML solution 75 mL (75 mLs Intravenous Contrast Given 08/29/14 2146)  clindamycin (CLEOCIN) IVPB 900 mg (0 mg Intravenous Stopped 08/29/14 2348)  dexamethasone (DECADRON) injection 10 mg (10 mg Intravenous Given 08/29/14 2317)  ketorolac (TORADOL) 30 MG/ML injection 30 mg (30 mg Intravenous Given 08/30/14 0008)    Discharge Medication List as of 08/29/2014 11:56 PM    START taking these medications   Details  HYDROcodone-acetaminophen (NORCO/VICODIN) 5-325 MG per tablet Take 1-2 tablets by mouth every 4 (four) hours as needed., Starting 08/29/2014, Until Discontinued, Print        Final diagnoses:  Phlegmonous tonsillitis  Tonsillar hypertrophy   This is a 29 year old female who presents the emergency room and complaining of a sore throat that has been ongoing for the past month. The patient was initially seen and evaluated on 08/08/2014 emergency department and had a CT scan of her soft tissues which indicated tonsillar hypertrophy. The patient was treated for viral tonsillitis at that time. The patient returned to the emergency department on 08/27/2014 complaining of continued sore throat and was started on clindamycin. Today she returns emergency department reporting she is has been unable to take her clindamycin today. She reports changes to her voice and difficulty swallowing. Patient has bilateral tonsillar hypertrophy  with her left greater than her right. Possibly concerning for tonsillar abscess. After discussing with my attending provided he requested checking a CBC and mono screen. The patient's CBC indicated the leukocytosis with a white count of 12.9. Her mono screen is negative. Will obtain CT of her soft tissue neck. CT of her neck indicated tonsillar hypertrophy, improved on the right, however worsened on the left. Also indicated ill-defined hypodensity in the left tonsil measuring 2.0 x 1.1 cm consistent with a phlegmon. A well-defined fluid connection was not definitively seen. I called and discussed this patient with otolaryngologist Dr. Suszanne Connerseoh who would like the patient to receive 90 mg of IV clindamycin and 20 mg of Decadron. The patient had previously received 10 mg of Decadron. Will give another 10 mg of Decadron IV and clindamycin. Dr. Suszanne Connerseoh would like  her to follow-up in the office next week. At reevaluation the patient reports feeling much better and has been able to tolerate liquids by mouth prior to discharge. On exam the patient's tonsillar hypertrophy is improved since initial evaluation. Patient is afebrile and nontoxic-appearing. The patient's voice is improved. Patient discharged with prescriptions for clindamycin and Norco for pain. I advised patient to follow-up with Dr. Suszanne Conners next week. I advised the patient to follow-up with their primary care provider this week. I advised the patient to return to the emergency department with new or worsening symptoms or new concerns. The patient verbalized understanding and agreement with plan.   I personally performed the services described in this documentation, which was scribed in my presence. The recorded information has been reviewed and is accurate.     Everlene Farrier, PA-C 08/30/14 2130  Nelva Nay, MD 08/30/14 1056

## 2014-08-29 NOTE — Discharge Instructions (Signed)
Pharyngitis/Phlegmon Pharyngitis is redness, pain, and swelling (inflammation) of your pharynx.  CAUSES  Pharyngitis is usually caused by infection. Most of the time, these infections are from viruses (viral) and are part of a cold. However, sometimes pharyngitis is caused by bacteria (bacterial). Pharyngitis can also be caused by allergies. Viral pharyngitis may be spread from person to person by coughing, sneezing, and personal items or utensils (cups, forks, spoons, toothbrushes). Bacterial pharyngitis may be spread from person to person by more intimate contact, such as kissing.  SIGNS AND SYMPTOMS  Symptoms of pharyngitis include:   Sore throat.   Tiredness (fatigue).   Low-grade fever.   Headache.  Joint pain and muscle aches.  Skin rashes.  Swollen lymph nodes.  Plaque-like film on throat or tonsils (often seen with bacterial pharyngitis). DIAGNOSIS  Your health care provider will ask you questions about your illness and your symptoms. Your medical history, along with a physical exam, is often all that is needed to diagnose pharyngitis. Sometimes, a rapid strep test is done. Other lab tests may also be done, depending on the suspected cause.  TREATMENT  Viral pharyngitis will usually get better in 3-4 days without the use of medicine. Bacterial pharyngitis is treated with medicines that kill germs (antibiotics).  HOME CARE INSTRUCTIONS   Drink enough water and fluids to keep your urine clear or pale yellow.   Only take over-the-counter or prescription medicines as directed by your health care provider:   If you are prescribed antibiotics, make sure you finish them even if you start to feel better.   Do not take aspirin.   Get lots of rest.   Gargle with 8 oz of salt water ( tsp of salt per 1 qt of water) as often as every 1-2 hours to soothe your throat.   Throat lozenges (if you are not at risk for choking) or sprays may be used to soothe your throat. SEEK  MEDICAL CARE IF:   You have large, tender lumps in your neck.  You have a rash.  You cough up green, yellow-brown, or bloody spit. SEEK IMMEDIATE MEDICAL CARE IF:   Your neck becomes stiff.  You drool or are unable to swallow liquids.  You vomit or are unable to keep medicines or liquids down.  You have severe pain that does not go away with the use of recommended medicines.  You have trouble breathing (not caused by a stuffy nose). MAKE SURE YOU:   Understand these instructions.  Will watch your condition.  Will get help right away if you are not doing well or get worse. Document Released: 06/06/2005 Document Revised: 03/27/2013 Document Reviewed: 02/11/2013 [facility] Patient Information 2015 [place], [place]. This information is not intended to replace advice given to you by your health care provider. Make sure you discuss any questions you have with your health care provider.

## 2014-08-30 LAB — CULTURE, GROUP A STREP

## 2014-08-30 MED ORDER — KETOROLAC TROMETHAMINE 30 MG/ML IJ SOLN
30.0000 mg | Freq: Once | INTRAMUSCULAR | Status: AC
  Administered 2014-08-30: 30 mg via INTRAVENOUS
  Filled 2014-08-30: qty 1

## 2014-09-04 ENCOUNTER — Ambulatory Visit (INDEPENDENT_AMBULATORY_CARE_PROVIDER_SITE_OTHER): Payer: Medicaid Other | Admitting: Otolaryngology

## 2015-03-12 ENCOUNTER — Emergency Department (HOSPITAL_COMMUNITY)
Admission: EM | Admit: 2015-03-12 | Discharge: 2015-03-12 | Disposition: A | Discharge status: Home / Self Care | Payer: Medicaid Other | Attending: Emergency Medicine | Admitting: Emergency Medicine

## 2015-03-12 ENCOUNTER — Encounter (HOSPITAL_COMMUNITY): Payer: Self-pay | Admitting: Emergency Medicine

## 2015-03-12 DIAGNOSIS — J029 Acute pharyngitis, unspecified: Secondary | ICD-10-CM | POA: Diagnosis present

## 2015-03-12 LAB — RAPID STREP SCREEN (MED CTR MEBANE ONLY): Streptococcus, Group A Screen (Direct): NEGATIVE

## 2015-03-12 MED ORDER — DEXAMETHASONE SODIUM PHOSPHATE 10 MG/ML IJ SOLN
10.0000 mg | Freq: Once | INTRAMUSCULAR | Status: AC
  Administered 2015-03-12: 10 mg via INTRAMUSCULAR
  Filled 2015-03-12: qty 1

## 2015-03-12 MED ORDER — ACETAMINOPHEN 325 MG PO TABS
650.0000 mg | ORAL_TABLET | Freq: Once | ORAL | Status: AC
  Administered 2015-03-12: 650 mg via ORAL
  Filled 2015-03-12: qty 2

## 2015-03-12 NOTE — Discharge Instructions (Signed)
Pharyngitis °Pharyngitis is redness, pain, and swelling (inflammation) of your pharynx.  °CAUSES  °Pharyngitis is usually caused by infection. Most of the time, these infections are from viruses (viral) and are part of a cold. However, sometimes pharyngitis is caused by bacteria (bacterial). Pharyngitis can also be caused by allergies. Viral pharyngitis may be spread from person to person by coughing, sneezing, and personal items or utensils (cups, forks, spoons, toothbrushes). Bacterial pharyngitis may be spread from person to person by more intimate contact, such as kissing.  °SIGNS AND SYMPTOMS  °Symptoms of pharyngitis include:   °· Sore throat.   °· Tiredness (fatigue).   °· Low-grade fever.   °· Headache. °· Joint pain and muscle aches. °· Skin rashes. °· Swollen lymph nodes. °· Plaque-like film on throat or tonsils (often seen with bacterial pharyngitis). °DIAGNOSIS  °Your health care provider will ask you questions about your illness and your symptoms. Your medical history, along with a physical exam, is often all that is needed to diagnose pharyngitis. Sometimes, a rapid strep test is done. Other lab tests may also be done, depending on the suspected cause.  °TREATMENT  °Viral pharyngitis will usually get better in 3-4 days without the use of medicine. Bacterial pharyngitis is treated with medicines that kill germs (antibiotics).  °HOME CARE INSTRUCTIONS  °· Drink enough water and fluids to keep your urine clear or pale yellow.   °· Only take over-the-counter or prescription medicines as directed by your health care provider:   °· If you are prescribed antibiotics, make sure you finish them even if you start to feel better.   °· Do not take aspirin.   °· Get lots of rest.   °· Gargle with 8 oz of salt water (½ tsp of salt per 1 qt of water) as often as every 1-2 hours to soothe your throat.   °· Throat lozenges (if you are not at risk for choking) or sprays may be used to soothe your throat. °SEEK MEDICAL  CARE IF:  °· You have large, tender lumps in your neck. °· You have a rash. °· You cough up green, yellow-brown, or bloody spit. °SEEK IMMEDIATE MEDICAL CARE IF:  °· Your neck becomes stiff. °· You drool or are unable to swallow liquids. °· You vomit or are unable to keep medicines or liquids down. °· You have severe pain that does not go away with the use of recommended medicines. °· You have trouble breathing (not caused by a stuffy nose). °MAKE SURE YOU:  °· Understand these instructions. °· Will watch your condition. °· Will get help right away if you are not doing well or get worse. °Document Released: 06/06/2005 Document Revised: 03/27/2013 Document Reviewed: 02/11/2013 °Exit[employer]® Patient Information ©2015 [employer]. This information is not intended to replace advice given to you by your health care provider. Make sure you discuss any questions you have with your health care provider. ° °Sore Throat °A sore throat is pain, burning, irritation, or scratchiness of the throat. There is often pain or tenderness when swallowing or talking. A sore throat may be accompanied by other symptoms, such as coughing, sneezing, fever, and swollen neck glands. A sore throat is often the first sign of another sickness, such as a cold, flu, strep throat, or mononucleosis (commonly known as mono). Most sore throats go away without medical treatment. °CAUSES  °The most common causes of a sore throat include: °· A viral infection, such as a cold, flu, or mono. °· A bacterial infection, such as strep throat, tonsillitis, or whooping cough. °·   Seasonal allergies.  Dryness in the air.  Irritants, such as smoke or pollution.  Gastroesophageal reflux disease (GERD). HOME CARE INSTRUCTIONS   Only take over-the-counter medicines as directed by your caregiver.  Drink enough fluids to keep your urine clear or pale yellow.  Rest as needed.  Try using throat sprays, lozenges, or sucking on hard candy to ease any pain (if  older than 4 years or as directed).  Sip warm liquids, such as broth, herbal tea, or warm water with honey to relieve pain temporarily. You may also eat or drink cold or frozen liquids such as frozen ice pops.  Gargle with salt water (mix 1 tsp salt with 8 oz of water).  Do not smoke and avoid secondhand smoke.  Put a cool-mist humidifier in your bedroom at night to moisten the air. You can also turn on a hot shower and sit in the bathroom with the door closed for 5-10 minutes. SEEK IMMEDIATE MEDICAL CARE IF:  You have difficulty breathing.  You are unable to swallow fluids, soft foods, or your saliva.  You have increased swelling in the throat.  Your sore throat does not get better in 7 days.  You have nausea and vomiting.  You have a fever or persistent symptoms for more than 2-3 days.  You have a fever and your symptoms suddenly get worse. MAKE SURE YOU:   Understand these instructions.  Will watch your condition.  Will get help right away if you are not doing well or get worse. Document Released: 07/14/2004 Document Revised: 05/23/2012 Document Reviewed: 02/12/2012 [facility] Patient Information 2015 [place], [place]. This information is not intended to replace advice given to you by your health care provider. Make sure you discuss any questions you have with your health care provider.  Upper Respiratory Infection, Adult An upper respiratory infection (URI) is also sometimes known as the common cold. The upper respiratory tract includes the nose, sinuses, throat, trachea, and bronchi. Bronchi are the airways leading to the lungs. Most people improve within 1 week, but symptoms can last up to 2 weeks. A residual cough may last even longer.  CAUSES Many different viruses can infect the tissues lining the upper respiratory tract. The tissues become irritated and inflamed and often become very moist. Mucus production is also common. A cold is contagious. You can easily spread the  virus to others by oral contact. This includes kissing, sharing a glass, coughing, or sneezing. Touching your mouth or nose and then touching a surface, which is then touched by another person, can also spread the virus. SYMPTOMS  Symptoms typically develop 1 to 3 days after you come in contact with a cold virus. Symptoms vary from person to person. They may include:  Runny nose.  Sneezing.  Nasal congestion.  Sinus irritation.  Sore throat.  Loss of voice (laryngitis).  Cough.  Fatigue.  Muscle aches.  Loss of appetite.  Headache.  Low-grade fever. DIAGNOSIS  You might diagnose your own cold based on familiar symptoms, since most people get a cold 2 to 3 times a year. Your caregiver can confirm this based on your exam. Most importantly, your caregiver can check that your symptoms are not due to another disease such as strep throat, sinusitis, pneumonia, asthma, or epiglottitis. Blood tests, throat tests, and X-rays are not necessary to diagnose a common cold, but they may sometimes be helpful in excluding other more serious diseases. Your caregiver will decide if any further tests are required. RISKS AND COMPLICATIONS  You may be at risk for a more severe case of the common cold if you smoke cigarettes, have chronic heart disease (such as heart failure) or lung disease (such as asthma), or if you have a weakened immune system. The very young and very old are also at risk for more serious infections. Bacterial sinusitis, middle ear infections, and bacterial pneumonia can complicate the common cold. The common cold can worsen asthma and chronic obstructive pulmonary disease (COPD). Sometimes, these complications can require emergency medical care and may be life-threatening. PREVENTION  The best way to protect against getting a cold is to practice good hygiene. Avoid oral or hand contact with people with cold symptoms. Wash your hands often if contact occurs. There is no clear evidence  that vitamin C, vitamin E, echinacea, or exercise reduces the chance of developing a cold. However, it is always recommended to get plenty of rest and practice good nutrition. TREATMENT  Treatment is directed at relieving symptoms. There is no cure. Antibiotics are not effective, because the infection is caused by a virus, not by bacteria. Treatment may include:  Increased fluid intake. Sports drinks offer valuable electrolytes, sugars, and fluids.  Breathing heated mist or steam (vaporizer or shower).  Eating chicken soup or other clear broths, and maintaining good nutrition.  Getting plenty of rest.  Using gargles or lozenges for comfort.  Controlling fevers with ibuprofen or acetaminophen as directed by your caregiver.  Increasing usage of your inhaler if you have asthma. Zinc gel and zinc lozenges, taken in the first 24 hours of the common cold, can shorten the duration and lessen the severity of symptoms. Pain medicines may help with fever, muscle aches, and throat pain. A variety of non-prescription medicines are available to treat congestion and runny nose. Your caregiver can make recommendations and may suggest nasal or lung inhalers for other symptoms.  HOME CARE INSTRUCTIONS   Only take over-the-counter or prescription medicines for pain, discomfort, or fever as directed by your caregiver.  Use a warm mist humidifier or inhale steam from a shower to increase air moisture. This may keep secretions moist and make it easier to breathe.  Drink enough water and fluids to keep your urine clear or pale yellow.  Rest as needed.  Return to work when your temperature has returned to normal or as your caregiver advises. You may need to stay home longer to avoid infecting others. You can also use a face mask and careful hand washing to prevent spread of the virus. SEEK MEDICAL CARE IF:   After the first few days, you feel you are getting worse rather than better.  You need your  caregiver's advice about medicines to control symptoms.  You develop chills, worsening shortness of breath, or brown or red sputum. These may be signs of pneumonia.  You develop yellow or brown nasal discharge or pain in the face, especially when you bend forward. These may be signs of sinusitis.  You develop a fever, swollen neck glands, pain with swallowing, or white areas in the back of your throat. These may be signs of strep throat. SEEK IMMEDIATE MEDICAL CARE IF:   You have a fever.  You develop severe or persistent headache, ear pain, sinus pain, or chest pain.  You develop wheezing, a prolonged cough, cough up blood, or have a change in your usual mucus (if you have chronic lung disease).  You develop sore muscles or a stiff neck. Document Released: 11/30/2000 Document Revised: 08/29/2011 Document Reviewed: 09/11/2013  ExitCare® Patient Information ©2015 [employer]. This information is not intended to replace advice given to you by your health care provider. Make sure you discuss any questions you have with your health care provider. ° °

## 2015-03-12 NOTE — ED Provider Notes (Signed)
CSN: [id number]     Arrival date & time 03/12/15  1158 History   First MD Initiated Contact with Patient 03/12/15 1236     Chief Complaint  Patient presents with  . Sore Throat   HPI  Desiree Daniel is a 29 year female presenting with sore throat. Pt states 2-3 days ago her pain began. Throat pain is bilateral and worsening by swallowing and eating. Pt denies difficulty handling secretions or breathing. She is able to swallow foods/liquids with pain but no difficulty. Pt also states that her voice is getting hoarse. Pt reports associated symptoms of fevers, chills, diaphoresis and body aches. She denies trying OTC medications for pain relief. Pt has chronic recurring sore throats over the past 9 months. She has been evaluated multiple times in the ED for similar pain with negative workups. Pt has been referred to ENT and primary care multiple times for follow up on chronic sore throat but reports she has yet to make an appointment. Denies headaches, ear pain, eye pain, eye redness, congestion, rhinorrhea, cough, chest pain, SOB, abdominal pain, nausea or vomiting.   Past Medical History  Diagnosis Date  . No pertinent past medical history   . Medical history non-contributory    Past Surgical History  Procedure Laterality Date  . Dilation and curettage of uterus     No family history on file. Social History  Substance Use Topics  . Smoking status: Never Smoker   . Smokeless tobacco: None  . Alcohol Use: Yes     Comment: occasional   OB History    Gravida Para Term Preterm AB TAB SAB Ectopic Multiple Living   0 1 0 1 0 0 1     Review of Systems  Constitutional: Positive for fever and chills.  HENT: Positive for sore throat and voice change (hoarse). Negative for congestion, ear pain, rhinorrhea and trouble swallowing.   Eyes: Negative for pain, discharge and redness.  Respiratory: Negative for cough and shortness of breath.   Cardiovascular: Negative for chest pain.   Gastrointestinal: Negative for nausea, vomiting and abdominal pain.  Musculoskeletal: Positive for myalgias.  Skin: Negative for rash.  Neurological: Negative for headaches.      Allergies  Review of patient's allergies indicates no known allergies.  Home Medications   Prior to Admission medications   Medication Sig Start Date End Date Taking? Authorizing Brandn Mcgath  acetaminophen (TYLENOL) 500 MG tablet Take 1,000-2,000 mg by mouth every 6 (six) hours as needed. For pain    Historical Jadarian Mckay, MD  acetaminophen-codeine 120-12 MG/5ML solution Take 10 mLs by mouth every 4 (four) hours as needed for moderate pain. 08/26/14   Charlestine Night, PA-C  clindamycin (CLEOCIN) 150 MG capsule Take 2 capsules (300 mg total) by mouth 4 (four) times daily. May dispense as  capsules 08/29/14   Everlene Farrier, PA-C  fluticasone [facility]) 50 MCG/ACT nasal spray Place 2 sprays into both nostrils daily. Patient not taking: Reported on 08/29/2014 08/07/14   Aviva Signs, CNM  HYDROcodone-acetaminophen (NORCO/VICODIN) 5-325 MG per tablet Take 1-2 tablets by mouth every 4 (four) hours as needed. 08/29/14   Everlene Farrier, PA-C  levonorgestrel (MIRENA) 20 MCG/24HR IUD 1 each by Intrauterine route once.    Historical Harue Pribble, MD   BP 142/84 mmHg  Pulse 105  Temp(Src) 100 F (37.8 C) (Oral)  Resp 22  Ht  (1.626 m)  Wt 169 lb (76.658 kg)  BMI 28.99 kg/m2  SpO2 99%  LMP  (  Approximate) Physical Exam  Constitutional: She appears well-developed and well-nourished. No distress.  HENT:  Head: Normocephalic and atraumatic.  Mouth/Throat: Oropharyngeal exudate present.  Bilateral hypertrophied erythematous tonsils. Both tonsils similar in size. White exudate noted to right tonsil. Uvula midline and not edematous. TMs pearly gray with no loss of landmarks.   Eyes: Conjunctivae are normal. Right eye exhibits no discharge. Left eye exhibits no discharge. No scleral icterus.  Neck: Normal range of  motion. Neck supple.  FROM of neck without pain. No soft tissue swelling of neck. No neck or submandibular tenderness.   Cardiovascular: Normal rate, regular rhythm and normal heart sounds.   Pulmonary/Chest: Effort normal and breath sounds normal. No stridor. No respiratory distress. She has no wheezes. She has no rales.  Abdominal: Soft. She exhibits no distension. There is no tenderness.  Musculoskeletal: Normal range of motion.  Lymphadenopathy:    She has cervical adenopathy (bilateral, tender).  Neurological: She is alert. Coordination normal.  Skin: Skin is warm and dry.  Psychiatric: She has a normal mood and affect. Her behavior is normal.  Nursing note and vitals reviewed.   ED Course  Procedures (including critical care time) Labs Review Labs Reviewed  RAPID STREP SCREEN (NOT AT [facility])  CULTURE, GROUP A STREP    Imaging Review No results found. I have personally reviewed and evaluated these images and lab results as part of my medical decision-making.   EKG Interpretation None      MDM   Final diagnoses:  Pharyngitis   Pt presenting with sore throat as noted above. Pt has recurrent sore throats and has been evaluated in ED multiple times without ENT follow up. Pt nontoxic appearing. Speaks with a hoarse voice. Bilateral erythematous and hypertrophied tonsils with white exudate over right tonsil. Pt breathing unlabored and handling secretions well. No stridor. Rapid strep negative, pending culture. No signs of tonsillar abscess or Ludwigs. Gave 10 mg decadron injection in ED; pt reports less pain with swallowing afterwards. Discussed importance of establishing primary care so that she can further evaluated her chronic sore throats. Pt had been given ENT referral to Dr. Suszanne Conners but has not scheduled appointment. Encouraged pt to call and schedule appointments with both. Return precautions given in discharge paperwork and discussed with pt at bedside. Pt stable for  discharge    Alveta Heimlich, PA-C 03/12/15 1821  Lyndal Pulley, MD 03/13/15 [phone]

## 2015-03-12 NOTE — ED Notes (Signed)
Started 2-3 days ago with sore throat and body aches. Has worsened.Denies N/V or abd.pain.

## 2015-03-15 LAB — CULTURE, GROUP A STREP

## 2015-06-25 ENCOUNTER — Ambulatory Visit (INDEPENDENT_AMBULATORY_CARE_PROVIDER_SITE_OTHER): Payer: Medicaid Other | Admitting: Otolaryngology

## 2015-06-25 DIAGNOSIS — J3501 Chronic tonsillitis: Secondary | ICD-10-CM | POA: Diagnosis not present

## 2015-06-30 ENCOUNTER — Other Ambulatory Visit: Payer: Self-pay | Admitting: Otolaryngology

## 2015-07-13 ENCOUNTER — Encounter (HOSPITAL_BASED_OUTPATIENT_CLINIC_OR_DEPARTMENT_OTHER): Payer: Self-pay | Admitting: *Deleted

## 2015-07-14 ENCOUNTER — Encounter (HOSPITAL_BASED_OUTPATIENT_CLINIC_OR_DEPARTMENT_OTHER): Payer: Self-pay | Admitting: *Deleted

## 2015-07-20 ENCOUNTER — Ambulatory Visit (HOSPITAL_BASED_OUTPATIENT_CLINIC_OR_DEPARTMENT_OTHER): Payer: Medicaid Other | Admitting: Anesthesiology

## 2015-07-20 ENCOUNTER — Encounter (HOSPITAL_BASED_OUTPATIENT_CLINIC_OR_DEPARTMENT_OTHER)
Admission: RE | Disposition: A | Discharge status: Home / Self Care | Payer: Self-pay | Source: Ambulatory Visit | Attending: Otolaryngology

## 2015-07-20 ENCOUNTER — Ambulatory Visit (HOSPITAL_BASED_OUTPATIENT_CLINIC_OR_DEPARTMENT_OTHER)
Admission: RE | Admit: 2015-07-20 | Discharge: 2015-07-20 | Disposition: A | Discharge status: Home / Self Care | Payer: Medicaid Other | Source: Ambulatory Visit | Attending: Otolaryngology | Admitting: Otolaryngology

## 2015-07-20 ENCOUNTER — Encounter (HOSPITAL_BASED_OUTPATIENT_CLINIC_OR_DEPARTMENT_OTHER): Payer: Self-pay

## 2015-07-20 DIAGNOSIS — J353 Hypertrophy of tonsils with hypertrophy of adenoids: Secondary | ICD-10-CM | POA: Diagnosis not present

## 2015-07-20 DIAGNOSIS — J3501 Chronic tonsillitis: Secondary | ICD-10-CM | POA: Diagnosis not present

## 2015-07-20 DIAGNOSIS — J351 Hypertrophy of tonsils: Secondary | ICD-10-CM | POA: Diagnosis not present

## 2015-07-20 DIAGNOSIS — F17290 Nicotine dependence, other tobacco product, uncomplicated: Secondary | ICD-10-CM | POA: Insufficient documentation

## 2015-07-20 DIAGNOSIS — J3503 Chronic tonsillitis and adenoiditis: Secondary | ICD-10-CM | POA: Diagnosis not present

## 2015-07-20 HISTORY — PX: TONSILLECTOMY AND ADENOIDECTOMY: SHX28

## 2015-07-20 SURGERY — TONSILLECTOMY AND ADENOIDECTOMY
Anesthesia: General | Site: Throat | Laterality: Bilateral

## 2015-07-20 MED ORDER — SUCCINYLCHOLINE CHLORIDE 20 MG/ML IJ SOLN
INTRAMUSCULAR | Status: DC | PRN
  Administered 2015-07-20: 100 mg via INTRAVENOUS

## 2015-07-20 MED ORDER — SODIUM CHLORIDE 0.9 % IR SOLN
Status: DC | PRN
  Administered 2015-07-20: 1

## 2015-07-20 MED ORDER — LABETALOL HCL 5 MG/ML IV SOLN
INTRAVENOUS | Status: AC
  Filled 2015-07-20: qty 4

## 2015-07-20 MED ORDER — ONDANSETRON 4 MG PO TBDP
4.0000 mg | ORAL_TABLET | Freq: Once | ORAL | Status: AC
  Administered 2015-07-20: 4 mg via ORAL

## 2015-07-20 MED ORDER — BACITRACIN 500 UNIT/GM EX OINT
TOPICAL_OINTMENT | CUTANEOUS | Status: DC | PRN
  Administered 2015-07-20: 1 via TOPICAL

## 2015-07-20 MED ORDER — ONDANSETRON HCL 4 MG/2ML IJ SOLN
INTRAMUSCULAR | Status: DC | PRN
  Administered 2015-07-20: 4 mg via INTRAVENOUS

## 2015-07-20 MED ORDER — MIDAZOLAM HCL 2 MG/2ML IJ SOLN
1.0000 mg | INTRAMUSCULAR | Status: DC | PRN
  Administered 2015-07-20: 2 mg via INTRAVENOUS

## 2015-07-20 MED ORDER — SCOPOLAMINE 1 MG/3DAYS TD PT72: 1.0000 | MEDICATED_PATCH | Freq: Once | TRANSDERMAL | Status: DC | PRN

## 2015-07-20 MED ORDER — PROPOFOL 500 MG/50ML IV EMUL
INTRAVENOUS | Status: AC
  Filled 2015-07-20: qty 50

## 2015-07-20 MED ORDER — OXYCODONE HCL 5 MG/5ML PO SOLN: 5.0000 mg | ORAL | Status: DC | PRN

## 2015-07-20 MED ORDER — PROMETHAZINE HCL 25 MG/ML IJ SOLN: 6.2500 mg | INTRAMUSCULAR | Status: DC | PRN

## 2015-07-20 MED ORDER — FENTANYL CITRATE (PF) 100 MCG/2ML IJ SOLN
50.0000 ug | INTRAMUSCULAR | Status: AC | PRN
  Administered 2015-07-20: 50 ug via INTRAVENOUS
  Administered 2015-07-20 (×2): 25 ug via INTRAVENOUS
  Administered 2015-07-20: 100 ug via INTRAVENOUS

## 2015-07-20 MED ORDER — LABETALOL HCL 5 MG/ML IV SOLN
5.0000 mg | INTRAVENOUS | Status: AC
  Administered 2015-07-20 (×2): 5 mg via INTRAVENOUS

## 2015-07-20 MED ORDER — GLYCOPYRROLATE 0.2 MG/ML IJ SOLN: 0.2000 mg | Freq: Once | INTRAMUSCULAR | Status: DC | PRN

## 2015-07-20 MED ORDER — ATROPINE SULFATE 0.4 MG/ML IJ SOLN
INTRAMUSCULAR | Status: AC
  Filled 2015-07-20: qty 1

## 2015-07-20 MED ORDER — HYDROMORPHONE HCL 1 MG/ML IJ SOLN
INTRAMUSCULAR | Status: AC
  Filled 2015-07-20: qty 1

## 2015-07-20 MED ORDER — OXYMETAZOLINE HCL 0.05 % NA SOLN
NASAL | Status: DC | PRN
  Administered 2015-07-20: 1 via TOPICAL

## 2015-07-20 MED ORDER — LIDOCAINE HCL (CARDIAC) 20 MG/ML IV SOLN
INTRAVENOUS | Status: DC | PRN
  Administered 2015-07-20: 50 mg via INTRAVENOUS

## 2015-07-20 MED ORDER — OXYCODONE HCL 5 MG/5ML PO SOLN: 5.0000 mg | Freq: Once | ORAL | Status: DC | PRN

## 2015-07-20 MED ORDER — MIDAZOLAM HCL 2 MG/2ML IJ SOLN
INTRAMUSCULAR | Status: AC
  Filled 2015-07-20: qty 2

## 2015-07-20 MED ORDER — HYDROMORPHONE HCL 1 MG/ML IJ SOLN
0.2500 mg | INTRAMUSCULAR | Status: DC | PRN
  Administered 2015-07-20 (×2): 0.5 mg via INTRAVENOUS

## 2015-07-20 MED ORDER — FENTANYL CITRATE (PF) 100 MCG/2ML IJ SOLN
INTRAMUSCULAR | Status: AC
  Filled 2015-07-20: qty 2

## 2015-07-20 MED ORDER — DEXAMETHASONE SODIUM PHOSPHATE 10 MG/ML IJ SOLN
INTRAMUSCULAR | Status: AC
  Filled 2015-07-20: qty 1

## 2015-07-20 MED ORDER — ONDANSETRON 8 MG PO TBDP
ORAL_TABLET | ORAL | Status: AC
  Filled 2015-07-20: qty 1

## 2015-07-20 MED ORDER — DEXAMETHASONE SODIUM PHOSPHATE 4 MG/ML IJ SOLN
INTRAMUSCULAR | Status: DC | PRN
  Administered 2015-07-20: 10 mg via INTRAVENOUS

## 2015-07-20 MED ORDER — LIDOCAINE HCL (CARDIAC) 20 MG/ML IV SOLN
INTRAVENOUS | Status: AC
  Filled 2015-07-20: qty 5

## 2015-07-20 MED ORDER — SUCCINYLCHOLINE CHLORIDE 20 MG/ML IJ SOLN
INTRAMUSCULAR | Status: AC
  Filled 2015-07-20: qty 1

## 2015-07-20 MED ORDER — BACITRACIN ZINC 500 UNIT/GM EX OINT
TOPICAL_OINTMENT | CUTANEOUS | Status: AC
  Filled 2015-07-20: qty 0.9

## 2015-07-20 MED ORDER — LABETALOL HCL 5 MG/ML IV SOLN
INTRAVENOUS | Status: DC | PRN
  Administered 2015-07-20: 5 mg via INTRAVENOUS

## 2015-07-20 MED ORDER — ONDANSETRON HCL 4 MG/2ML IJ SOLN
INTRAMUSCULAR | Status: AC
  Filled 2015-07-20: qty 2

## 2015-07-20 MED ORDER — PROMETHAZINE HCL 25 MG RE SUPP
RECTAL | Status: AC
  Filled 2015-07-20: qty 1

## 2015-07-20 MED ORDER — OXYMETAZOLINE HCL 0.05 % NA SOLN
NASAL | Status: AC
  Filled 2015-07-20: qty 15

## 2015-07-20 MED ORDER — PROPOFOL 10 MG/ML IV BOLUS
INTRAVENOUS | Status: DC | PRN
  Administered 2015-07-20: 200 mg via INTRAVENOUS

## 2015-07-20 MED ORDER — AMOXICILLIN 400 MG/5ML PO SUSR: 800.0000 mg | Freq: Two times a day (BID) | ORAL | Status: AC

## 2015-07-20 MED ORDER — OXYCODONE HCL 5 MG PO TABS: 5.0000 mg | ORAL_TABLET | Freq: Once | ORAL | Status: DC | PRN

## 2015-07-20 MED ORDER — LACTATED RINGERS IV SOLN
INTRAVENOUS | Status: DC
  Administered 2015-07-20 (×2): via INTRAVENOUS

## 2015-07-20 MED ORDER — PROMETHAZINE HCL 25 MG RE SUPP
25.0000 mg | Freq: Once | RECTAL | Status: AC | PRN
  Administered 2015-07-20: 25 mg via RECTAL

## 2015-07-20 SURGICAL SUPPLY — 32 items
BANDAGE COBAN STERILE 2 (GAUZE/BANDAGES/DRESSINGS) IMPLANT
CANISTER SUCT 1200ML W/VALVE (MISCELLANEOUS) ×3 IMPLANT
CATH ROBINSON RED A/P 10FR (CATHETERS) IMPLANT
CATH ROBINSON RED A/P 14FR (CATHETERS) ×2 IMPLANT
COAGULATOR SUCT 6 FR SWTCH (ELECTROSURGICAL)
COAGULATOR SUCT SWTCH 10FR 6 (ELECTROSURGICAL) IMPLANT
CONT SPEC 4OZ CLIKSEAL STRL BL (MISCELLANEOUS) ×4 IMPLANT
COVER MAYO STAND STRL (DRAPES) ×3 IMPLANT
ELECT REM PT RETURN 9FT ADLT (ELECTROSURGICAL) ×3
ELECT REM PT RETURN 9FT PED (ELECTROSURGICAL)
ELECTRODE REM PT RETRN 9FT PED (ELECTROSURGICAL) IMPLANT
ELECTRODE REM PT RTRN 9FT ADLT (ELECTROSURGICAL) IMPLANT
GLOVE BIO SURGEON STRL SZ7.5 (GLOVE) ×3 IMPLANT
GLOVE SURG SS PI 7.0 STRL IVOR (GLOVE) ×2 IMPLANT
GOWN STRL REUS W/ TWL LRG LVL3 (GOWN DISPOSABLE) ×2 IMPLANT
GOWN STRL REUS W/TWL LRG LVL3 (GOWN DISPOSABLE) ×9
IV NS 500ML (IV SOLUTION) ×3
IV NS 500ML BAXH (IV SOLUTION) ×1 IMPLANT
MARKER SKIN DUAL TIP RULER LAB (MISCELLANEOUS) IMPLANT
NS IRRIG 1000ML POUR BTL (IV SOLUTION) ×3 IMPLANT
SHEET MEDIUM DRAPE 40X70 STRL (DRAPES) ×3 IMPLANT
SOLUTION BUTLER CLEAR DIP (MISCELLANEOUS) ×3 IMPLANT
SPONGE GAUZE 4X4 12PLY STER LF (GAUZE/BANDAGES/DRESSINGS) ×3 IMPLANT
SPONGE TONSIL 1 RF SGL (DISPOSABLE) IMPLANT
SPONGE TONSIL 1.25 RF SGL STRG (GAUZE/BANDAGES/DRESSINGS) ×2 IMPLANT
SYR BULB 3OZ (MISCELLANEOUS) ×2 IMPLANT
TOWEL OR 17X24 6PK STRL BLUE (TOWEL DISPOSABLE) ×3 IMPLANT
TUBE CONNECTING 20'X1/4 (TUBING) ×1
TUBE CONNECTING 20X1/4 (TUBING) ×2 IMPLANT
TUBE SALEM SUMP 12R W/ARV (TUBING) IMPLANT
TUBE SALEM SUMP 16 FR W/ARV (TUBING) ×2 IMPLANT
WAND COBLATOR 70 EVAC XTRA (SURGICAL WAND) ×3 IMPLANT

## 2015-07-20 NOTE — Transfer of Care (Signed)
Immediate Anesthesia Transfer of Care Note  Patient: Desiree Daniel  Procedure(s) Performed: Procedure(s): TONSILLECTOMY AND ADENOIDECTOMY (Bilateral)  Patient Location: PACU  Anesthesia Type:General  Level of Consciousness: awake  Airway & Oxygen Therapy: Patient Spontanous Breathing and Patient connected to face mask oxygen  Post-op Assessment: Report given to RN and Post -op Vital signs reviewed and stable  Post vital signs: Reviewed and stable  Last Vitals:  Filed Vitals:   07/20/15 0729  BP: 152/101  Pulse: 88  Temp: 36.7 C  Resp: 18    Complications: No apparent anesthesia complications

## 2015-07-20 NOTE — Discharge Instructions (Addendum)
[facility] °Postoperative Instructions for Tonsillectomy & Adenoidectomy (T&A) °Activity °Restrict activity at home for the first two days, resting as much as possible. Light indoor activity is best. You may usually return to school or work within a week but void strenuous activity and sports for two weeks. Sleep with your head elevated on 2-3 pillows for 3-4 days to help decrease swelling. °Diet °Due to tissue swelling and throat discomfort, you may have little desire to drink for several days. However fluids are very important to prevent dehydration. You will find that non-acidic juices, soups, popsicles, Jell-O, custard, puddings, and any soft or mashed foods taken in small quantities can be swallowed fairly easily. Try to increase your fluid and food intake as the discomfort subsides. It is recommended that a child receive 1-1/2 quarts of fluid in a 24-hour period. Adult require twice this amount.  °Discomfort °Your sore throat may be relieved by applying an ice collar to your neck and/or by taking Tylenol®. You may experience an earache, which is due to referred pain from the throat. Referred ear pain is commonly felt at night when trying to rest. ° °Bleeding                        Although rare, there is risk of having some bleeding during the first 2 weeks after having a T&A. This usually happens between days 7-10 postoperatively. If you or your child should have any bleeding, try to remain calm. We recommend sitting up quietly in a chair and gently spitting out the blood into a bowl. For adults, gargling gently with ice water may help. If the bleeding does not stop after a short time (5 minutes), is more than 1 teaspoonful, or if you become worried, please call our office at [phone] or go directly to the nearest hospital emergency room. Do not eat or drink anything prior to going to the hospital as you may need to be taken to the operating room in order to control the bleeding. °GENERAL  CONSIDERATIONS °1. Brush your teeth regularly. Avoid mouthwashes and gargles for three weeks. You may gargle gently with warm salt-water as necessary or spray with Chloraseptic®. You may make salt-water by placing 2 teaspoons of table salt into a quart of fresh water. Warm the salt-water in a microwave to a luke warm temperature.  °2. Avoid exposure to colds and upper respiratory infections if possible.  °3. If you look into a mirror or into your child's mouth, you will see white-gray patches in the back of the throat. This is normal after having a T&A and is like a scab that forms on the skin after an abrasion. It will disappear once the back of the throat heals completely. However, it may cause a noticeable odor; this too will disappear with time. Again, warm salt-water gargles may be used to help keep the throat clean and promote healing.  °4. You may notice a temporary change in voice quality, such as a higher pitched voice or a nasal sound, until healing is complete. This may last for 1-2 weeks and should resolve.  °5. Do not take or give you child any medications that we have not prescribed or recommended.  °6. Snoring may occur, especially at night, for the first week after a T&A. It is due to swelling of the soft palate and will usually resolve.  °Please call our office at [phone] if you have any questions.   ° ° ° °  Post Anesthesia Home Care Instructions ° °Activity: °Get plenty of rest for the remainder of the day. A responsible adult should stay with you for 24 hours following the procedure.  °For the next 24 hours, DO NOT: °-Drive a car °-Operate machinery °-Drink alcoholic beverages °-Take any medication unless instructed by your physician °-Make any legal decisions or sign important papers. ° °Meals: °Start with liquid foods such as gelatin or soup. Progress to regular foods as tolerated. Avoid greasy, spicy, heavy foods. If nausea and/or vomiting occur, drink only clear liquids until the nausea  and/or vomiting subsides. Call your physician if vomiting continues. ° °Special Instructions/Symptoms: °Your throat may feel dry or sore from the anesthesia or the breathing tube placed in your throat during surgery. If this causes discomfort, gargle with warm salt water. The discomfort should disappear within 24 hours. ° °If you had a scopolamine patch placed behind your ear for the management of post- operative nausea and/or vomiting: ° °1. The medication in the patch is effective for 72 hours, after which it should be removed.  Wrap patch in a tissue and discard in the trash. Wash hands thoroughly with soap and water. °2. You may remove the patch earlier than 72 hours if you experience unpleasant side effects which may include dry mouth, dizziness or visual disturbances. °3. Avoid touching the patch. Wash your hands with soap and water after contact with the patch. °  ° °

## 2015-07-20 NOTE — Anesthesia Preprocedure Evaluation (Addendum)
Anesthesia Evaluation  Patient identified by MRN, date of birth, ID band Patient awake    Reviewed: Allergy & Precautions, NPO status , Patient's Chart, lab work & pertinent test results  Airway Mallampati: II  TM Distance: >3 FB Neck ROM: Full    Dental   Pulmonary Current Smoker,    breath sounds clear to auscultation       Cardiovascular negative cardio ROS   Rhythm:Regular Rate:Normal     Neuro/Psych negative neurological ROS     GI/Hepatic negative GI ROS, Neg liver ROS,   Endo/Other  negative endocrine ROS  Renal/GU negative Renal ROS     Musculoskeletal   Abdominal   Peds  Hematology negative hematology ROS (+)   Anesthesia Other Findings   Reproductive/Obstetrics                            Anesthesia Physical Anesthesia Plan  ASA: II  Anesthesia Plan: General   Post-op Pain Management:    Induction: Intravenous  Airway Management Planned: Oral ETT  Additional Equipment:   Intra-op Plan:   Post-operative Plan: Extubation in OR  Informed Consent: I have reviewed the patients History and Physical, chart, labs and discussed the procedure including the risks, benefits and alternatives for the proposed anesthesia with the patient or authorized representative who has indicated his/her understanding and acceptance.   Dental advisory given  Plan Discussed with: CRNA  Anesthesia Plan Comments:         Anesthesia Quick Evaluation

## 2015-07-20 NOTE — H&P (Signed)
Cc: Recurrent tonsillitis/pharyngitis  HPI: The patient is a 30 y/o female who presents today for evaluation of recurrent tonsillitis. The patient is seen in consultation requested by [facility]. The patient has noted recurrent sore throat for the past several years but the frequency of her infections has increased. She was treated 4-5 times last year. Her most recent sore throat occurred one month ago. The patient is otherwise healthy. No dysphagia or odynophagia is currently noted. Previous ENT surgery is denied.  The patient's review of systems (constitutional, eyes, ENT, cardiovascular, respiratory, GI, musculoskeletal, skin, neurologic, psychiatric, endocrine, hematologic, allergic) is noted in the ROS questionnaire.  It is reviewed with the patient.   Family health history: Hypertension.   Major events: Dilation and curettage of uterus.   Ongoing medical problems: Allergies.   Social history: The patient is single. She does drink alcohol daily. . She denies the use of illegal drugs. She smokes 2-3 cigars a week.  Exam General: Communicates without difficulty, well nourished, no acute distress. Head:  Normocephalic, no lesions or asymmetry. Eyes: PERRL, EOMI. No scleral icterus, conjunctivae clear.  Neuro: CN II exam reveals vision grossly intact.  No nystagmus at any point of gaze. Ears:  EAC normal without erythema AU.  TM intact without fluid and mobile AU. Nose: Moist, pink mucosa without lesions or mass. Mouth: Oral cavity clear and moist, no lesions, tonsils symmetric. Tonsils are 3+. Tonsils with mild erythema. Neck: Full range of motion, no lymphadenopathy or masses.   Assessment The patient's history and physical exam findings are consistent with chronic tonsillitis/pharyngitis secondary to tonsillar hypertrophy.  Plan 1.  The treatment options include continuing conservative observation versus tonsillectomy.  Based on the patient's history and physical exam findings, the  patient will likely benefit from having the tonsils and possibly the adenoid removed.  The risks, benefits, alternatives, and details of the procedure are reviewed with the patient.  Questions are invited and answered.  2.  The patient is interested in proceeding with the procedure.  We will schedule the procedure in accordance with the family schedule.

## 2015-07-20 NOTE — Op Note (Signed)
DATE OF PROCEDURE:  07/20/2015                              OPERATIVE REPORT  SURGEON:  Newman Pies, MD  PREOPERATIVE DIAGNOSES: 1. Adenotonsillar hypertrophy. 2. Chronic tonsillitis and pharyngitis  POSTOPERATIVE DIAGNOSES: 1. Adenotonsillar hypertrophy. 2. Chronic tonsillitis and pharyngitis  PROCEDURE PERFORMED:  Adenotonsillectomy.  ANESTHESIA:  General endotracheal tube anesthesia.  COMPLICATIONS:  None.  ESTIMATED BLOOD LOSS:  Minimal.  INDICATION FOR PROCEDURE:  Desiree Daniel is a 30 y.o. female with a history of chronic tonsillitis/pharyngitis and halitosis.  According to the patient, she has been experiencing chronic throat discomfort with halitosis for several years. The patient continues to be symptomatic despite medical treatments. On examination, the patient was noted to have bilateral cryptic tonsils, with numerous tonsilloliths. Based on the above findings, the decision was made for the patient to undergo the adenotonsillectomy procedure. Likelihood of success in reducing symptoms was also discussed.  The risks, benefits, alternatives, and details of the procedure were discussed with the mother.  Questions were invited and answered.  Informed consent was obtained.  DESCRIPTION:  The patient was taken to the operating room and placed supine on the operating table.  General endotracheal tube anesthesia was administered by the anesthesiologist.  The patient was positioned and prepped and draped in a standard fashion for adenotonsillectomy.  A Crowe-Davis mouth gag was inserted into the oral cavity for exposure. 4+ cryptic tonsils were noted bilaterally.  No bifidity was noted.  Indirect mirror examination of the nasopharynx revealed severe adenoid hypertrophy. The adenoid was resected with the Coblator device. Hemostasis was achieved with the Coblator device.  The right tonsil was then grasped with a straight Allis clamp and retracted medially.  It was resected free from the  underlying pharyngeal constrictor muscles with the Coblator device.  The same procedure was repeated on the left side without exception.  The surgical sites were copiously irrigated.  The mouth gag was removed.  The care of the patient was turned over to the anesthesiologist.  The patient was awakened from anesthesia without difficulty.  The patient was extubated and transferred to the recovery room in good condition.  OPERATIVE FINDINGS:  Adenotonsillar hypertrophy.  SPECIMEN:  Bilateral tonsils and adenoid.  FOLLOWUP CARE:  The patient will be discharged home once awake and alert.  She will be placed on amoxicillin 800 mg p.o. b.i.d. for 5 days, and oxycodone 5-66ml po q 4 hours for postop pain control.   The patient will follow up in my office in approximately 2 weeks.  [name] 07/20/2015 9:51 AM

## 2015-07-20 NOTE — Anesthesia Procedure Notes (Signed)
Procedure Name: Intubation Date/Time: 07/20/2015 8:40 AM Performed by: Caren Macadam Pre-anesthesia Checklist: Patient identified, Emergency Drugs available, Suction available and Patient being monitored Patient Re-evaluated:Patient Re-evaluated prior to inductionOxygen Delivery Method: Circle System Utilized Preoxygenation: Pre-oxygenation with 100% oxygen Intubation Type: IV induction Ventilation: Mask ventilation without difficulty Laryngoscope Size: Miller and 2 Grade View: Grade I Tube type: Oral Tube size: 7.0 mm Number of attempts: 1 Airway Equipment and Method: Stylet and Oral airway Placement Confirmation: ETT inserted through vocal cords under direct vision,  positive ETCO2 and breath sounds checked- equal and bilateral Secured at: 22 cm Tube secured with: Tape Dental Injury: Teeth and Oropharynx as per pre-operative assessment

## 2015-07-20 NOTE — Anesthesia Postprocedure Evaluation (Signed)
Anesthesia Post Note  Patient: Desiree Daniel  Procedure(s) Performed: Procedure(s) (LRB): TONSILLECTOMY AND ADENOIDECTOMY (Bilateral)  Patient location during evaluation: PACU Anesthesia Type: General Level of consciousness: awake and alert Pain management: pain level controlled Vital Signs Assessment: post-procedure vital signs reviewed and stable Respiratory status: spontaneous breathing Cardiovascular status: blood pressure returned to baseline : Nausea after IV pulled. Tx'd with zofran ODT and phenergan suppository. Anesthetic complications: no    Last Vitals:  Filed Vitals:   07/20/15 1115 07/20/15 1200  BP: 146/107 145/104  Pulse: 83 68  Temp: 36.4 C   Resp: 16 16    Last Pain:  Filed Vitals:   07/20/15 1223  PainSc: 4                  Kennieth Rad

## 2015-07-21 ENCOUNTER — Encounter (HOSPITAL_BASED_OUTPATIENT_CLINIC_OR_DEPARTMENT_OTHER): Payer: Self-pay | Admitting: Otolaryngology

## 2015-07-21 LAB — POCT HEMOGLOBIN-HEMACUE: Hemoglobin: 15.3 g/dL — ABNORMAL HIGH (ref 12.0–15.0)

## 2015-07-30 ENCOUNTER — Ambulatory Visit (INDEPENDENT_AMBULATORY_CARE_PROVIDER_SITE_OTHER): Payer: Medicaid Other | Admitting: Otolaryngology

## 2016-08-14 ENCOUNTER — Encounter (HOSPITAL_COMMUNITY): Payer: Self-pay | Admitting: Emergency Medicine

## 2016-08-14 ENCOUNTER — Emergency Department (HOSPITAL_COMMUNITY)
Admission: EM | Admit: 2016-08-14 | Discharge: 2016-08-14 | Disposition: A | Discharge status: Home / Self Care | Payer: Medicaid Other | Attending: Emergency Medicine | Admitting: Emergency Medicine

## 2016-08-14 DIAGNOSIS — I10 Essential (primary) hypertension: Secondary | ICD-10-CM | POA: Insufficient documentation

## 2016-08-14 DIAGNOSIS — Z87891 Personal history of nicotine dependence: Secondary | ICD-10-CM | POA: Insufficient documentation

## 2016-08-14 DIAGNOSIS — R519 Headache, unspecified: Secondary | ICD-10-CM

## 2016-08-14 DIAGNOSIS — Z79899 Other long term (current) drug therapy: Secondary | ICD-10-CM | POA: Insufficient documentation

## 2016-08-14 DIAGNOSIS — R51 Headache: Secondary | ICD-10-CM | POA: Insufficient documentation

## 2016-08-14 MED ORDER — AMLODIPINE BESYLATE 5 MG PO TABS: 5.0000 mg | ORAL_TABLET | Freq: Every day | ORAL | 0 refills | Status: DC

## 2016-08-14 MED ORDER — NAPROXEN 500 MG PO TABS: 500.0000 mg | ORAL_TABLET | Freq: Two times a day (BID) | ORAL | 0 refills | Status: AC

## 2016-08-14 NOTE — ED Triage Notes (Signed)
Pt. Stated,  I went to health dept for physical  And they said I had high blood pressure and to come here.  No medicine was given.

## 2016-08-14 NOTE — ED Provider Notes (Signed)
[facility]EM[facility]PT Provider Note   CSN: [id number] Arrival date & time: 08/14/16  1039     History   Chief Complaint Chief Complaint  Patient presents with  . Hypertension    HPI Desiree Daniel is a 31 y.o. female.  The history is provided by the patient.  Headache   This is a new problem. The current episode started 2 days ago. The problem occurs constantly. The problem has not changed since onset.The headache is associated with nothing. The quality of the pain is described as dull. The pain is mild. The pain does not radiate. She has tried nothing for the symptoms.    Past Medical History:  Diagnosis Date  . Cough 07/14/2015  . Stuffy nose 07/14/2015  . Tonsillar and adenoid hypertrophy 06/2015    Patient Active Problem List   Diagnosis Date Noted  . SVD (spontaneous vaginal delivery) 02/11/2011    Past Surgical History:  Procedure Laterality Date  . DILATION AND EVACUATION  12/21/2006  . TONSILLECTOMY AND ADENOIDECTOMY Bilateral 07/20/2015   Procedure: TONSILLECTOMY AND ADENOIDECTOMY;  Surgeon: Newman PiesSu Teoh, MD;  Location: [facility];  Service: ENT;  Laterality: Bilateral;    OB History    Gravida Para Term Preterm AB Living   3 1 1  0 1 1   SAB TAB Ectopic Multiple Live Births   1 0 0 0 1       Home Medications    Prior to Admission medications   Medication Sig Start Date End Date Taking? Authorizing Provider  ibuprofen (ADVIL,MOTRIN) 200 MG tablet Take 200 mg by mouth every 6 (six) hours as needed for fever.   Yes Historical Provider, MD  levonorgestrel (MIRENA) 20 MCG/24HR IUD 1 each by Intrauterine route once.   Yes Historical Provider, MD  oxyCODONE (ROXICODONE) 5 MG/5ML solution Take 5-10 mLs (5-10 mg total) by mouth every 4 (four) hours as needed for severe pain. Patient not taking: Reported on 08/14/2016 07/20/15   Newman PiesSu Teoh, MD    Family History No family history on file.  Social History Social History  Substance Use Topics  .  Smoking status: Former Smoker    Years: 2.00    Types: Cigars  . Smokeless tobacco: Former NeurosurgeonUser     Comment: 1 cigar/day  . Alcohol use Yes     Comment: 2-3 glasses wine/day     Allergies   Patient has no known allergies.   Review of Systems Review of Systems  Neurological: Positive for headaches.  All other systems reviewed and are negative.    Physical Exam Updated Vital Signs BP (!) 148/102   Pulse 81   Temp 98.2 F (36.8 C) (Oral)   Resp 17   Ht 5\' 4"  (1.626 m)   Wt 203 lb 7 oz (92.3 kg)   LMP 08/09/2016   SpO2 99%   BMI 34.92 kg/m   Physical Exam  Constitutional: She is oriented to person, place, and time. She appears well-developed and well-nourished. No distress.  HENT:  Head: Normocephalic.  Nose: Nose normal.  Eyes: Conjunctivae are normal.  Neck: Neck supple. No tracheal deviation present.  Cardiovascular: Normal rate, regular rhythm and normal heart sounds.   Pulmonary/Chest: Effort normal and breath sounds normal. No respiratory distress.  Abdominal: Soft. She exhibits no distension. There is no tenderness.  Musculoskeletal: Normal range of motion.  Neurological: She is alert and oriented to person, place, and time. No cranial nerve deficit. Coordination normal.  Skin: Skin is warm and dry.  Psychiatric: She has a normal mood and affect.  Vitals reviewed.    ED Treatments / Results  Labs (all labs ordered are listed, but only abnormal results are displayed) Labs Reviewed - No data to display  EKG  EKG Interpretation None       Radiology No results found.  Procedures Procedures (including critical care time)  Medications Ordered in ED Medications - No data to display   Initial Impression / Assessment and Plan / ED Course  I have reviewed the triage vital signs and the nursing notes.  Pertinent labs & imaging results that were available during my care of the patient were reviewed by me and considered in my medical decision making  (see chart for details).     31 y.o. female presents with headache starting recently. Went to health department who sent her here because she was hypertensive. She has been hypertensive to her knowledge since 2015 and has been putting off evaluation. Patient needs to establish primary care in the area and was provided contact information to do so. Recommended NSAIDs for what appears to be tension HA and amlodipine started for longstanding hypertension until able to establish f/u.   Final Clinical Impressions(s) / ED Diagnoses   Final diagnoses:  Essential hypertension  Nonintractable headache, unspecified chronicity pattern, unspecified headache type    New Prescriptions Discharge Medication List as of 08/14/2016 12:13 PM    START taking these medications   Details  amLODipine (NORVASC) 5 MG tablet Take 1 tablet (5 mg total) by mouth daily., Starting Sun 08/14/2016, Print    naproxen (NAPROSYN) 500 MG tablet Take 1 tablet (500 mg total) by mouth 2 (two) times daily with a meal., Starting Sun 08/14/2016, Until Fri 08/19/2016, Print         Lyndal Pulley, MD 08/14/16 [phone]

## 2016-12-08 IMAGING — CT CT NECK W/ CM
3 of 6 series · 10 of 33 positions shown, 12 images · IV contrast (Iodine)
Comparison: CT neck 08/08/2014

CLINICAL DATA: Gradual worsening of sore throat for 1 month with
difficulty swallowing. Tonsillar hypertrophy, question peritonsillar
abscess.

EXAM:
CT NECK WITH CONTRAST
TECHNIQUE: Multidetector CT imaging of the neck was performed using the
standard protocol following the bolus administration of intravenous
contrast.
CONTRAST:  75mL OMNIPAQUE IOHEXOL 300 MG/ML  SOLN

[Series 207: coronals · coronal · 0.47mm/px · 3 of 100 slices shown]
[im 20/100  bone]
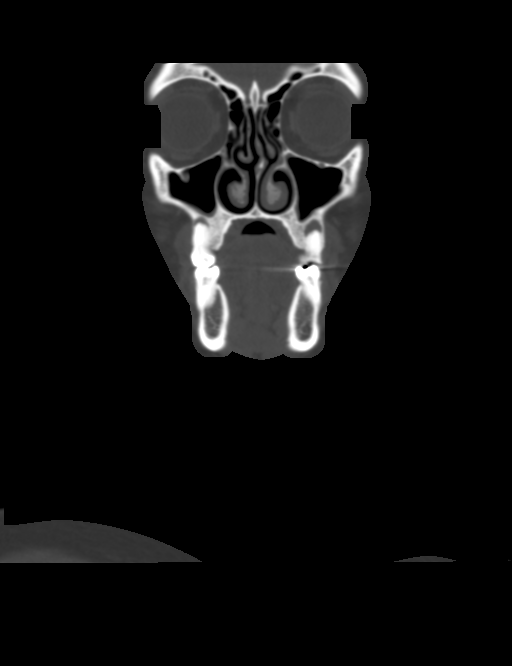
[im 40/100  bone]
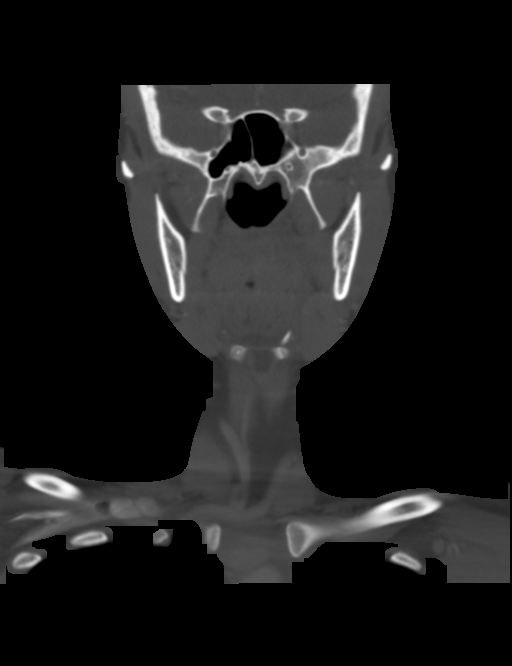
[im 60/100  bone]
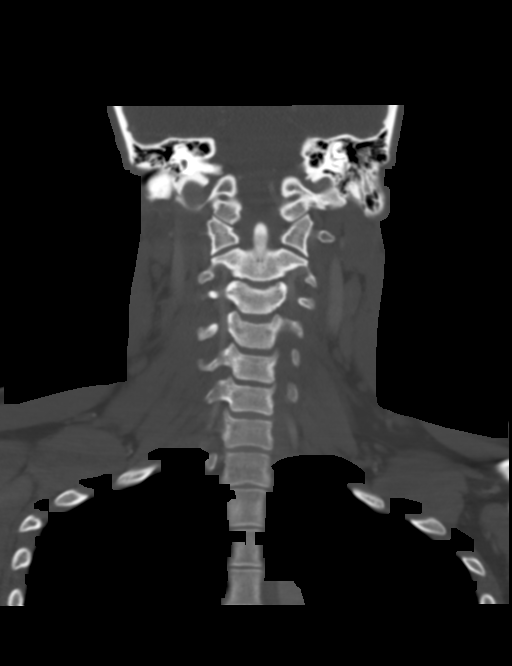

[Series 208: sagittals · sagittal · 0.47mm/px · 5 of 114 slices shown, 6 images]
[im 38/114  bone]
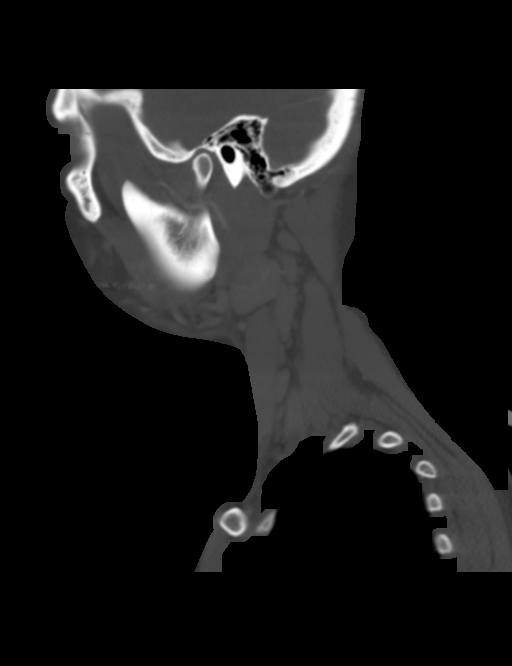
[im 48/114  bone]
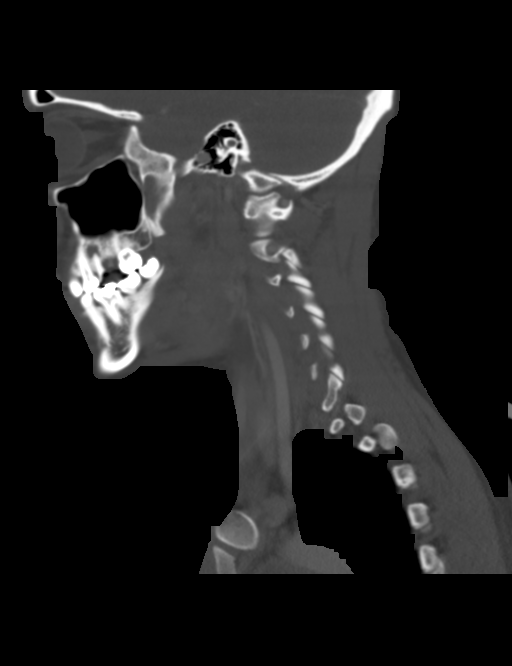
[im 57/114  soft-tissue]
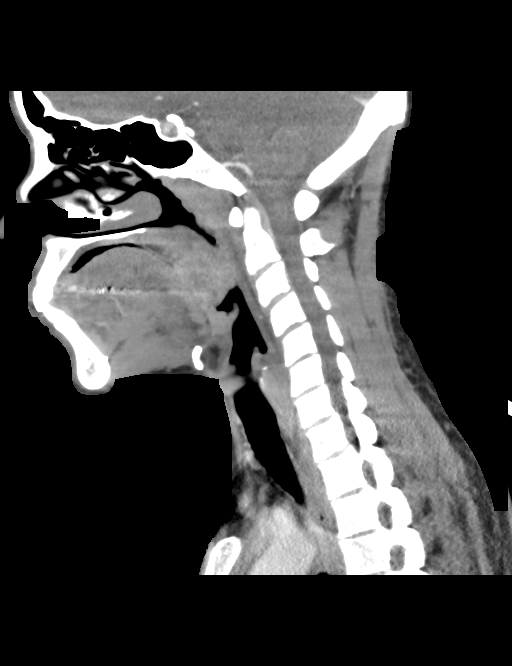
[im 57/114  bone]
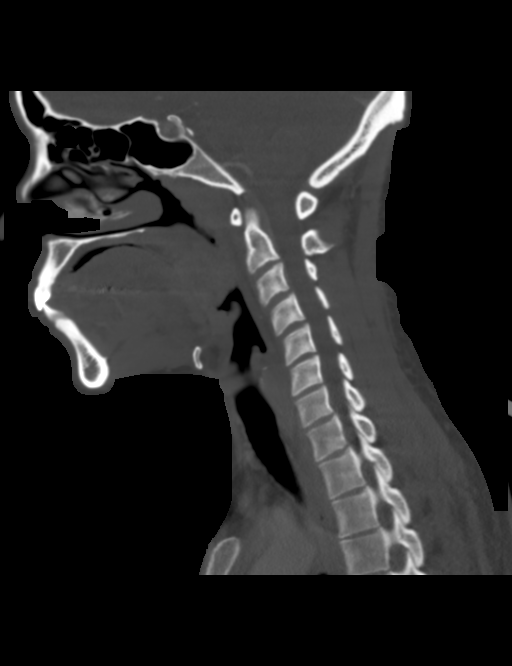
[im 66/114  bone]
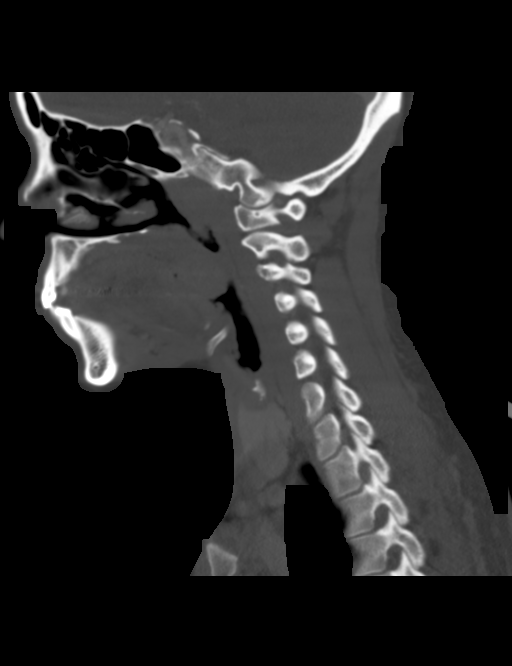
[im 76/114  bone]
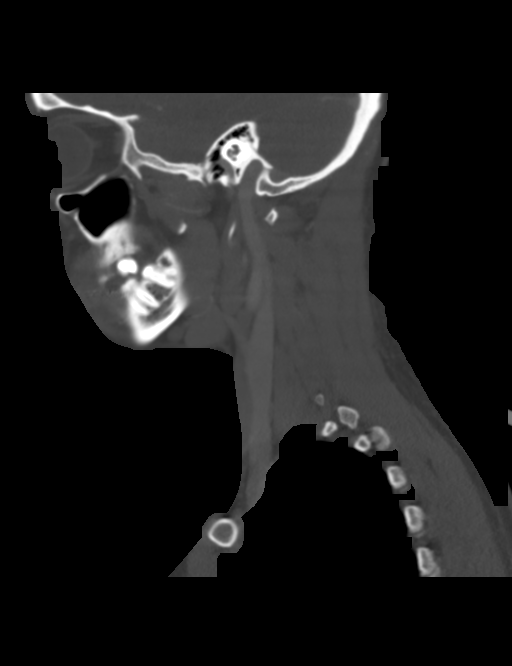

[Series 209: ortho · axial · 0.47mm/px · z∈[+78,+135]mm · 2 of 95 slices shown, 3 images]
[im 32/95  soft-tissue]
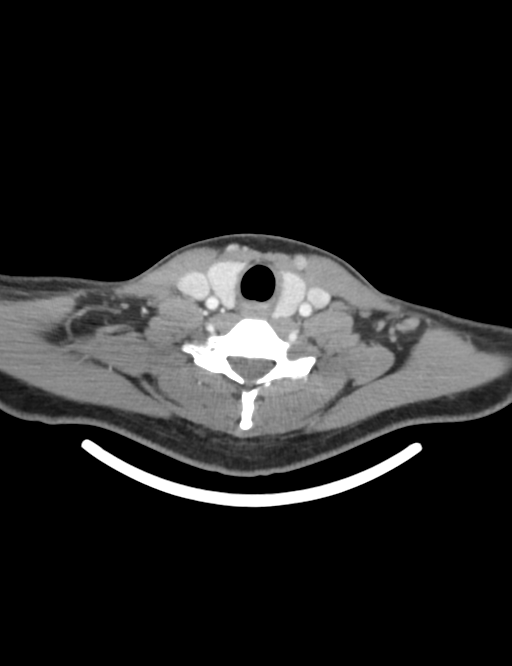
[im 32/95  bone]
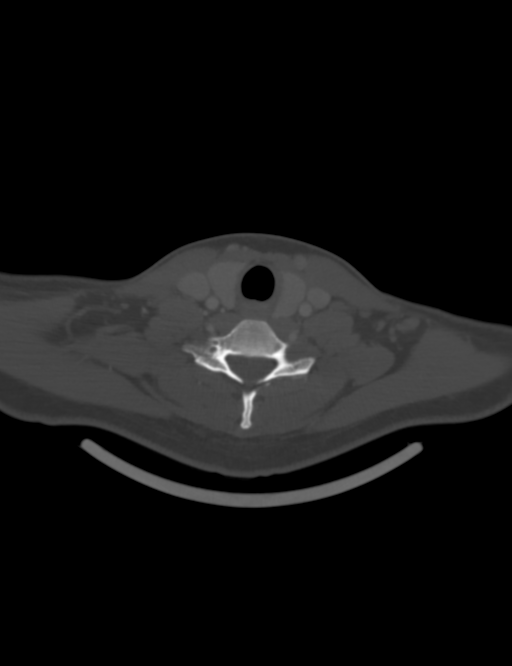
[im 63/95  bone]
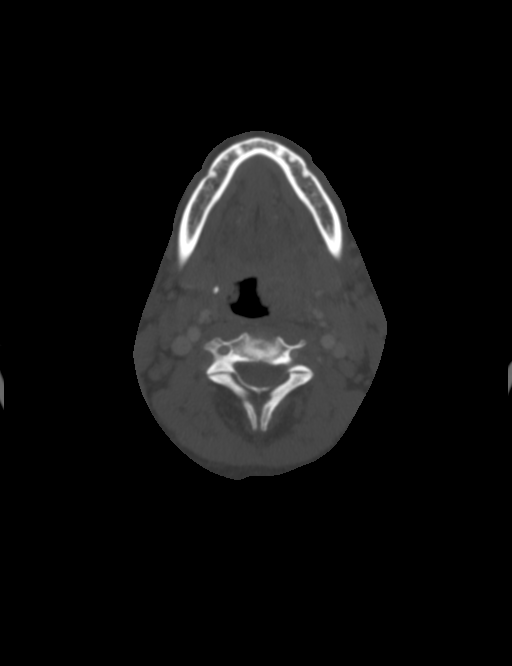

[10 of 33 positions shown; findings below may reference images not displayed]

FINDINGS: Pharynx and larynx: Tonsillar hypertrophy is again seen, this is
progressed involving the left palatine tonsil, is improved involving
the right palatine tonsil. The previous sub cm low-density within
the right tonsil has resolved. There is developing ill-defined
hypodensity within the left tonsil measuring 2.0 x 11 cm that may
reflect phlegmon, no well-defined fluid collection or abscess. No
peritonsillar fluid collection.

Salivary glands: Homogeneous enhancement without focal mass.

Thyroid: Normal, no nodule noted.

Lymph nodes: Prominent bilateral cervical lymph nodes are again
seen. There is overall improvement in the right and progression on
the left. Level to lymph node on the left measures 1.4 cm,
previously 1.0 cm. On the right this measures 1.0 cm, previously
cm.

Vascular: Normal.

Limited intracranial: Normal.

Visualized orbits: Normal.

Mastoids and visualized paranasal sinuses: Trace mucosal thickening
bilaterally. Periapical lucencies about the right and left upper
molars may reflect dental caries.

Skeleton: Normal.

Upper chest: Lung apices are clear.
IMPRESSION: 1. Tonsillar hypertrophy, improved on the right, however worsened on
the left. Ill-defined hypodensity in the left tonsil measuring 2.0 x
1.1 cm consistent with phlegmon, a well-defined fluid collection is
not definitively seen.
2. Reactive adenopathy, also improved in the right and worsened on
the left.

## 2016-12-19 ENCOUNTER — Emergency Department (HOSPITAL_COMMUNITY)
Admission: EM | Admit: 2016-12-19 | Discharge: 2016-12-19 | Disposition: A | Discharge status: Home / Self Care | Payer: Medicaid Other | Attending: Emergency Medicine | Admitting: Emergency Medicine

## 2016-12-19 ENCOUNTER — Encounter (HOSPITAL_COMMUNITY): Payer: Self-pay

## 2016-12-19 DIAGNOSIS — L0231 Cutaneous abscess of buttock: Secondary | ICD-10-CM | POA: Insufficient documentation

## 2016-12-19 DIAGNOSIS — Z87891 Personal history of nicotine dependence: Secondary | ICD-10-CM | POA: Insufficient documentation

## 2016-12-19 DIAGNOSIS — L0501 Pilonidal cyst with abscess: Secondary | ICD-10-CM | POA: Insufficient documentation

## 2016-12-19 DIAGNOSIS — L02411 Cutaneous abscess of right axilla: Secondary | ICD-10-CM | POA: Insufficient documentation

## 2016-12-19 DIAGNOSIS — Z791 Long term (current) use of non-steroidal anti-inflammatories (NSAID): Secondary | ICD-10-CM | POA: Insufficient documentation

## 2016-12-19 MED ORDER — DOXYCYCLINE HYCLATE 100 MG PO CAPS: 100.0000 mg | ORAL_CAPSULE | Freq: Two times a day (BID) | ORAL | 0 refills | Status: DC

## 2016-12-19 NOTE — Discharge Instructions (Signed)
Take the medication as directed and [facility] return here as needed. Take advil as needed for pain.

## 2016-12-19 NOTE — ED Triage Notes (Signed)
Per Pt, Pt is coming from home with complaints of reoccurring boils under her arms and on her buttocks. Pt denies fevers. Reports some serous sanguinous fluid.

## 2016-12-19 NOTE — ED Provider Notes (Signed)
[facility]PT Provider Note   CSN: [id number] Arrival date & time: 12/19/16  1334   By signing my name below, I, Clarisse GougeXavier Herndon, attest that this documentation has been prepared under the direction and in the presence of Kerrie BuffaloHope Langley Flatley, NP . Electronically signed, Clarisse GougeXavier Herndon, ED Scribe. 12/19/16. 5:40 PM.   History   Chief Complaint Chief Complaint  Patient presents with  . Recurrent Skin Infections   The history is provided by the patient and medical records. No language interpreter was used.    Desiree Daniel is a 31 y.o. female with h/o recurrent abscesses presenting to the Emergency Department concerning multiple raised, indurated, erythematous bumps to her R axilla and buttocks x > 2 weeks. She states the problem is worsening recently. Some associated drainage noted. States she has quit shaving and changed deodorants in an attempt to prevent and decrease the occurrence of these bumps without relief. Pt reports she has had 1 abscess lanced ~6-8 years ago; no h/o abx treatment for abscesses. No fevers, chills, N/V or suspicion of pregnancy. Pt states she uses mirena as a contraceptive. No PCP or h/o dermatologist F/U noted. No other complaints at this time.   Past Medical History:  Diagnosis Date  . Cough 07/14/2015  . Stuffy nose 07/14/2015  . Tonsillar and adenoid hypertrophy 06/2015    Patient Active Problem List   Diagnosis Date Noted  . SVD (spontaneous vaginal delivery) 02/11/2011    Past Surgical History:  Procedure Laterality Date  . DILATION AND EVACUATION  12/21/2006  . TONSILLECTOMY AND ADENOIDECTOMY Bilateral 07/20/2015   Procedure: TONSILLECTOMY AND ADENOIDECTOMY;  Surgeon: Newman PiesSu Teoh, MD;  Location: [facility];  Service: ENT;  Laterality: Bilateral;    OB History    Gravida Para Term Preterm AB Living   3 1 1  0 1 1   SAB TAB Ectopic Multiple Live Births   1 0 0 0 1       Home Medications    Prior to Admission medications     Medication Sig Start Date End Date Taking? Authorizing Provider  amLODipine (NORVASC) 5 MG tablet Take 1 tablet (5 mg total) by mouth daily. 08/14/16   Lyndal PulleyKnott, Daniel, MD  doxycycline (VIBRAMYCIN) 100 MG capsule Take 1 capsule (100 mg total) by mouth 2 (two) times daily. 12/19/16   Janne NapoleonNeese, Kaylaann Mountz M, NP  ibuprofen (ADVIL,MOTRIN) 200 MG tablet Take 200 mg by mouth every 6 (six) hours as needed for fever.    [provider]  levonorgestrel (MIRENA) 20 MCG/24HR IUD 1 each by Intrauterine route once.    [provider]  oxyCODONE (ROXICODONE) 5 MG/5ML solution Take 5-10 mLs (5-10 mg total) by mouth every 4 (four) hours as needed for severe pain. Patient not taking: Reported on 08/14/2016 07/20/15   Newman Pieseoh, Su, MD    Family History No family history on file.  Social History Social History  Substance Use Topics  . Smoking status: Former Smoker    Years: 2.00    Types: Cigars  . Smokeless tobacco: Former NeurosurgeonUser     Comment: 1 cigar/day  . Alcohol use Yes     Comment: 2-3 glasses wine/day     Allergies   Patient has no known allergies.   Review of Systems Review of Systems  Constitutional: Negative for chills and fever.  Gastrointestinal: Negative for nausea and vomiting.  Skin: Positive for color change and wound.  All other systems reviewed and are negative.    Physical Exam Updated Vital Signs  BP (!) 152/98 (BP Location: Left Arm)   Pulse 91   Temp 98.5 F (36.9 C) (Oral)   Resp 15   Ht 5\' 4"  (1.626 m)   Wt 195 lb (88.5 kg)   LMP 12/05/2016 (Approximate)   SpO2 97%   BMI 33.47 kg/m   Physical Exam  Constitutional: She appears well-developed and well-nourished. No distress.  HENT:  Head: Normocephalic.  Eyes: EOM are normal.  Neck: Neck supple.  Cardiovascular: Normal rate.   Pulmonary/Chest: Effort normal.  Musculoskeletal: Normal range of motion.  Neurological: She is alert.  Skin:  Raised, tender, draining, 2 cm area at the gluteal fold. < 2 cm  draining, tender area to the R buttocks. Another draining, tender, 1 cm area to the R axilla. Small area of surrounding erythema. No red streaking.  Psychiatric: She has a normal mood and affect. Her behavior is normal.  Nursing note and vitals reviewed.    ED Treatments / Results  DIAGNOSTIC STUDIES: Oxygen Saturation is 97% on RA, NL by my interpretation.    COORDINATION OF CARE: 5:36 PM-Discussed next steps with pt. Pt verbalized understanding and is agreeable with the plan. Will Rx medication and refer to general surgery. Pt prepared for d/c, advised of symptomatic care at home, F/U instructions and return precautions.   Radiology No results found.  Procedures Procedures (including critical care time)  Medications Ordered in ED Medications - No data to display   Initial Impression / Assessment and Plan / ED Course  I have reviewed the triage vital signs and the nursing notes.  Final Clinical Impressions(s) / ED Diagnoses  31 y.o. female with multiple abscesses stable for d/c without fever and does not appear toxic. Since the areas are draining, will start doxycyline and patient will use warm wet soaks on the areas. She will f/u with general surgery. She will return here for any problems.  Final diagnoses:  Cyst, pilonidal, with abscess  abscess buttock, right Right axilla abscess  New Prescriptions Discharge Medication List as of 12/19/2016  5:40 PM    START taking these medications   Details  doxycycline (VIBRAMYCIN) 100 MG capsule Take 1 capsule (100 mg total) by mouth 2 (two) times daily., Starting Mon 12/19/2016, Print      I personally performed the services described in this documentation, which was scribed in my presence. The recorded information has been reviewed and is accurate.    Kerrie Buffalo [place], [place] 12/19/16 1752    Lorre Nick, MD 12/19/16 Barry Brunner

## 2017-02-18 DIAGNOSIS — L732 Hidradenitis suppurativa: Secondary | ICD-10-CM

## 2017-02-18 HISTORY — DX: Hidradenitis suppurativa: L73.2

## 2017-03-02 ENCOUNTER — Ambulatory Visit: Payer: Self-pay | Admitting: Surgery

## 2017-03-02 NOTE — H&P (Signed)
Desiree Daniel 03/02/2017 10:37 AM Location: [facility] Patient #: [id number] DOB: 09-Aug-1985 Single / Language: [place] / Race: Black or African American Female  History of Present Illness (Chelsea A. Fredricka Bonineonnor MD; 03/02/2017 11:39 AM) Patient words: This is a very pleasant 31 year old woman who has had recurrent abscesses and soft tissue infections in the gluteal region as well as bilateral axilla for many years. She has had 1 I&D performed in the emergency department on the gluteal disease recently. She has been on antibiotics once for the same. She has adopted the no-shave, deodorants, gentle soap lifestyle. She is interested in any options that will definitively stop us from happening.  The patient is a 31 year old female.   Diagnostic Studies History (Tanisha A. Manson PasseyBrown, RMA; 03/02/2017 10:37 AM) Colonoscopy never Mammogram never Pap Smear 1-5 years ago  Allergies (Tanisha A. Manson PasseyBrown, RMA; 03/02/2017 10:38 AM) No Known Drug Allergies 03/02/2017 Allergies Reconciled  Medication History (Tanisha A. Manson PasseyBrown, RMA; 03/02/2017 10:38 AM) No Current Medications Medications Reconciled  Social History (Tanisha A. Manson PasseyBrown, RMA; 03/02/2017 10:37 AM) Alcohol use Occasional alcohol use. Caffeine use Carbonated beverages, Tea. Illicit drug use Remotely quit drug use. Tobacco use Former smoker.  Family History (Tanisha A. Manson PasseyBrown, RMA; 03/02/2017 10:37 AM) Cancer Father. Hypertension Father. Kidney Disease Father. Seizure disorder Father.  Pregnancy / Birth History (Tanisha A. Manson PasseyBrown, RMA; 03/02/2017 10:37 AM) Age at menarche 11 years. Contraceptive History Depo-provera, Intrauterine device, Oral contraceptives. Gravida 3 Irregular periods Length (months) of breastfeeding 3-6 Maternal age 32-25 Para 1  Other Problems (Tanisha A. Manson PasseyBrown, RMA; 03/02/2017 10:37 AM) High blood pressure     Review of Systems (Tanisha A. Brown RMA; 03/02/2017 10:37  AM) General Not Present- Appetite Loss, Chills, Fatigue, Fever, Night Sweats, Weight Gain and Weight Loss. Skin Present- Non-Healing Wounds. Not Present- Change in Wart/Mole, Dryness, Hives, Jaundice, New Lesions, Rash and Ulcer. HEENT Not Present- Earache, Hearing Loss, Hoarseness, Nose Bleed, Oral Ulcers, Ringing in the Ears, Seasonal Allergies, Sinus Pain, Sore Throat, Visual Disturbances, Wears glasses/contact lenses and Yellow Eyes. Respiratory Not Present- Bloody sputum, Chronic Cough, Difficulty Breathing, Snoring and Wheezing. Breast Not Present- Breast Mass, Breast Pain, Nipple Discharge and Skin Changes. Cardiovascular Not Present- Chest Pain, Difficulty Breathing Lying Down, Leg Cramps, Palpitations, Rapid Heart Rate, Shortness of Breath and Swelling of Extremities. Gastrointestinal Not Present- Abdominal Pain, Bloating, Bloody Stool, Change in Bowel Habits, Chronic diarrhea, Constipation, Difficulty Swallowing, Excessive gas, Gets full quickly at meals, Hemorrhoids, Indigestion, Nausea, Rectal Pain and Vomiting. Female Genitourinary Not Present- Frequency, Nocturia, Painful Urination, Pelvic Pain and Urgency. Musculoskeletal Not Present- Back Pain, Joint Pain, Joint Stiffness, Muscle Pain, Muscle Weakness and Swelling of Extremities. Neurological Not Present- Decreased Memory, Fainting, Headaches, Numbness, Seizures, Tingling, Tremor, Trouble walking and Weakness. Psychiatric Not Present- Anxiety, Bipolar, Change in Sleep Pattern, Depression, Fearful and Frequent crying. Endocrine Not Present- Cold Intolerance, Excessive Hunger, Hair Changes, Heat Intolerance, Hot flashes and New Diabetes. Hematology Not Present- Blood Thinners, Easy Bruising, Excessive bleeding, Gland problems, HIV and Persistent Infections.  Vitals (Tanisha A. Brown RMA; 03/02/2017 10:38 AM) 03/02/2017 10:38 AM Weight: 209.6 lb Height: 65in Body Surface Area: 2.02 m Body Mass Index: 34.88 kg/m  Temp.: 97.10F   Pulse: 93 (Regular)  P.OX: 97% (Room air) BP: 136/84 (Sitting, Left Arm, Standard)      Physical Exam (Chelsea A. Fredricka Bonineonnor MD; 03/02/2017 11:40 AM)  General Note: alert well-appearing  Integumentary Note: Skin is warm and dry. She has chronic scar and bilateral axilla without any  active draining sinuses or fistula, no active abscess at this time. In the sacral and gluteal region, she has 2 areas of chronic scarring, superior aspect has a scant amount of purulent drainage and granulation tissue. The inferior aspect is a healing superficial wound.  Chest and Lung Exam Note: Unlabored respirations, symmetrical air entry  Cardiovascular Note: Regular rate and rhythm, no pedal edema  Abdomen Note: Obese, nontender nondistended  Neurologic Note: Grossly intact, normal gait  Neuropsychiatric Note: Normal mood affect, appropriate insight  Musculoskeletal Note: Strength symmetrical throughout, no deformity    Assessment & Plan (Chelsea A. Connor MD; 03/02/2017 11:42 AM)  HIDRADENITIS (L73.2) Story: She has chronic disease in bilateral axilla, at this point consists of dense scar tissue spots without any active draining areas. Evidence of gluteal abscess 2 with chronic granulation and minimal tenderness. I discussed with her the possibilities of using a topical antibiotic ointment, versus systemic therapy which would be prescribed by dermatologist, and offered her local excision of the 2 areas on her buttock. She will like to proceed with this. We discussed the risks of bleeding, infection, pain, scarring, wound problems and high risk of wound dehiscence and need for wet-to-dry dressing changes at home. She is understanding and desires to proceed.

## 2017-03-17 ENCOUNTER — Encounter (HOSPITAL_BASED_OUTPATIENT_CLINIC_OR_DEPARTMENT_OTHER): Payer: Self-pay | Admitting: *Deleted

## 2017-03-24 ENCOUNTER — Ambulatory Visit (HOSPITAL_BASED_OUTPATIENT_CLINIC_OR_DEPARTMENT_OTHER): Admission: RE | Admit: 2017-03-24 | Payer: Self-pay | Source: Ambulatory Visit | Admitting: Surgery

## 2017-03-24 HISTORY — DX: Essential (primary) hypertension: I10

## 2017-03-24 HISTORY — DX: Hidradenitis suppurativa: L73.2

## 2017-03-24 SURGERY — EXCISION, HIDRADENITIS, AXILLA
Anesthesia: Choice

## 2020-02-26 ENCOUNTER — Other Ambulatory Visit: Payer: Self-pay

## 2020-02-26 ENCOUNTER — Other Ambulatory Visit: Payer: Medicaid Other

## 2020-02-26 DIAGNOSIS — Z20822 Contact with and (suspected) exposure to covid-19: Secondary | ICD-10-CM

## 2020-02-28 LAB — NOVEL CORONAVIRUS, NAA: SARS-CoV-2, NAA: DETECTED — AB

## 2020-02-28 LAB — SARS-COV-2, NAA 2 DAY TAT

## 2020-02-29 ENCOUNTER — Telehealth: Payer: Self-pay | Admitting: Family

## 2020-02-29 NOTE — Telephone Encounter (Signed)
Called to Discuss with patient about Covid symptoms and the use of the monoclonal antibody infusion for those with mild to moderate Covid symptoms and at a high risk of hospitalization.     Pt appears to qualify for this infusion due to co-morbid conditions and/or a member of an at-risk group in accordance with the FDA Emergency Use Authorization.    Ms. Desiree Daniel had a positive COVID test on 02/26/20. Qualifying risk factors include BMI >25I.  Unable to get in touch with [name] via phone. Left generic voicemail with hotline number. Information sent to MyChart.   Marcos Eke, NP 02/29/2020 10:53 AM

## 2020-03-03 ENCOUNTER — Ambulatory Visit: Payer: Self-pay | Admitting: Obstetrics

## 2020-03-18 ENCOUNTER — Encounter: Payer: Self-pay | Admitting: Obstetrics

## 2020-03-18 ENCOUNTER — Other Ambulatory Visit (HOSPITAL_COMMUNITY)
Admission: RE | Admit: 2020-03-18 | Discharge: 2020-03-18 | Disposition: A | Discharge status: Home / Self Care | Payer: Medicaid Other | Source: Ambulatory Visit | Attending: Obstetrics | Admitting: Obstetrics

## 2020-03-18 ENCOUNTER — Ambulatory Visit (INDEPENDENT_AMBULATORY_CARE_PROVIDER_SITE_OTHER): Payer: Medicaid Other | Admitting: Obstetrics

## 2020-03-18 ENCOUNTER — Other Ambulatory Visit: Payer: Self-pay

## 2020-03-18 VITALS — BP 148/107 | HR 104 | Ht 64.0 in | Wt 164.0 lb

## 2020-03-18 DIAGNOSIS — I10 Essential (primary) hypertension: Secondary | ICD-10-CM

## 2020-03-18 DIAGNOSIS — Z3009 Encounter for other general counseling and advice on contraception: Secondary | ICD-10-CM

## 2020-03-18 DIAGNOSIS — N898 Other specified noninflammatory disorders of vagina: Secondary | ICD-10-CM | POA: Insufficient documentation

## 2020-03-18 DIAGNOSIS — Z1272 Encounter for screening for malignant neoplasm of vagina: Secondary | ICD-10-CM | POA: Diagnosis present

## 2020-03-18 DIAGNOSIS — Z01419 Encounter for gynecological examination (general) (routine) without abnormal findings: Secondary | ICD-10-CM

## 2020-03-18 NOTE — Progress Notes (Addendum)
Subjective:        Desiree Daniel is a 34 y.o. female here for a routine exam.  Current complaints: Vaginal discharge with odor.  IUD has expired and she requests removal.  Does not desire any contraception after removal of IUD.  Personal health questionnaire:  Is patient Ashkenazi Jewish, have a family history of breast and/or ovarian cancer: no Is there a family history of uterine cancer diagnosed at age < 26, gastrointestinal cancer, urinary tract cancer, family member who is a Personnel officer syndrome-associated carrier: no Is the patient overweight and hypertensive, family history of diabetes, personal history of gestational diabetes, preeclampsia or PCOS: no Is patient over 59, have PCOS,  family history of premature CHD under age 3, diabetes, smoke, have hypertension or peripheral artery disease:  no At any time, has a partner hit, kicked or otherwise hurt or frightened you?: no Over the past 2 weeks, have you felt down, depressed or hopeless?: no Over the past 2 weeks, have you felt little interest or pleasure in doing things?:no   Gynecologic History No LMP recorded. Contraception: IUD Last Pap: unknown. Results were: unknown Last mammogram: n/a. Results were: n/a  Obstetric History OB History  Gravida Para Term Preterm AB Living  3 1 1  0 1 1  SAB TAB Ectopic Multiple Live Births  1 0 0 0 1    # Outcome Date GA Lbr Len/2nd Weight Sex Delivery Anes PTL Lv  3 Term 02/10/11 [redacted]w[redacted]d 21:46 / 01:07 7 lb 10 oz (3.459 kg) F Vag-Spont EPI  LIV  2 SAB 2009 [redacted]w[redacted]d   U         Birth Comments: 16weeks PPROM SAB  1 Gravida             Past Medical History:  Diagnosis Date  . Hidradenitis 02/2017   buttock  . Hypertension    no current med.    Past Surgical History:  Procedure Laterality Date  . DILATION AND EVACUATION  12/21/2006  . TONSILLECTOMY AND ADENOIDECTOMY Bilateral 07/20/2015   Procedure: TONSILLECTOMY AND ADENOIDECTOMY;  Surgeon: 07/22/2015, MD;  Location: [facility];  Service: ENT;  Laterality: Bilateral;    No current outpatient medications on file. No Known Allergies  Social History   Tobacco Use  . Smoking status: Former Smoker    Years: 0.00    Quit date: 10/17/2016    Years since quitting: 3.4  . Smokeless tobacco: Never Used  Substance Use Topics  . Alcohol use: Yes    Comment: socially    History reviewed. No pertinent family history.    Review of Systems  Constitutional: negative for fatigue and weight loss Respiratory: negative for cough and wheezing Cardiovascular: negative for chest pain, fatigue and palpitations Gastrointestinal: negative for abdominal pain and change in bowel habits Musculoskeletal:negative for myalgias Neurological: negative for gait problems and tremors Behavioral/Psych: negative for abusive relationship, depression Endocrine: negative for temperature intolerance    Genitourinary:negative for abnormal menstrual periods, genital lesions, hot flashes, sexual problems and vaginal discharge Integument/breast: negative for breast lump, breast tenderness, nipple discharge and skin lesion(s)    Objective:       BP (!) 148/107   Pulse (!) 104   Ht 5\' 4"  (1.626 m)   Wt 164 lb (74.4 kg)   BMI 28.15 kg/m  General:   alert and no distress  Skin:   no rash or abnormalities  Lungs:   clear to auscultation bilaterally  Heart:   regular rate  and rhythm, S1, S2 normal, no murmur, click, rub or gallop  Breasts:   normal without suspicious masses, skin or nipple changes or axillary nodes  Abdomen:  normal findings: no organomegaly, soft, non-tender and no hernia  Pelvis:  External genitalia: normal general appearance Urinary system: urethral meatus normal and bladder without fullness, nontender Vaginal: normal without tenderness, induration or masses Cervix: normal appearance.  IUD string grasped with long dressing forceps and removed, intact Adnexa: normal bimanual exam Uterus: anteverted and  non-tender, normal size   Lab Review Urine pregnancy test Labs reviewed yes Radiologic studies reviewed no  50% of 20 min visit spent on counseling and coordination of care.   Assessment:     1. Encounter for Papanicolaou smear of vagina as part of routine gynecological examination Rx: - Cytology - PAP( [place])  2. Vaginal discharge Rx: - Cervicovaginal ancillary only( [place])  3. HTN (hypertension), benign - not well controlled. managed by PCP  4. Encounter for other general counseling and advice on contraception - declined contraception    Plan:    Education reviewed: calcium supplements, depression evaluation, low fat, low cholesterol diet, safe sex/STD prevention, self breast exams and weight bearing exercise. Contraception: none. Follow up in: 1 year.     Brock Bad, MD 03/18/2020 3:57 PM

## 2020-03-19 ENCOUNTER — Encounter: Payer: Self-pay | Admitting: Obstetrics

## 2020-03-19 ENCOUNTER — Other Ambulatory Visit: Payer: Self-pay | Admitting: Obstetrics

## 2020-03-19 DIAGNOSIS — A5901 Trichomonal vulvovaginitis: Secondary | ICD-10-CM

## 2020-03-19 DIAGNOSIS — N760 Acute vaginitis: Secondary | ICD-10-CM

## 2020-03-19 LAB — CERVICOVAGINAL ANCILLARY ONLY
Bacterial Vaginitis (gardnerella): POSITIVE — AB
Candida Glabrata: NEGATIVE
Candida Vaginitis: NEGATIVE
Chlamydia: NEGATIVE
Comment: NEGATIVE
Comment: NEGATIVE
Comment: NEGATIVE
Comment: NEGATIVE
Comment: NEGATIVE
Comment: NORMAL
Neisseria Gonorrhea: NEGATIVE
Trichomonas: POSITIVE — AB

## 2020-03-19 MED ORDER — METRONIDAZOLE 500 MG PO TABS: 500.0000 mg | ORAL_TABLET | Freq: Two times a day (BID) | ORAL | 2 refills | Status: DC

## 2020-03-20 LAB — CYTOLOGY - PAP
Comment: NEGATIVE
Diagnosis: NEGATIVE
High risk HPV: NEGATIVE

## 2020-06-28 ENCOUNTER — Emergency Department (HOSPITAL_COMMUNITY)
Admission: EM | Admit: 2020-06-28 | Discharge: 2020-06-28 | Disposition: A | Discharge status: Home / Self Care | Payer: Medicaid Other | Attending: Emergency Medicine | Admitting: Emergency Medicine

## 2020-06-28 ENCOUNTER — Other Ambulatory Visit: Payer: Self-pay

## 2020-06-28 ENCOUNTER — Encounter (HOSPITAL_COMMUNITY): Payer: Self-pay | Admitting: *Deleted

## 2020-06-28 ENCOUNTER — Ambulatory Visit (HOSPITAL_COMMUNITY)
Admission: EM | Admit: 2020-06-28 | Discharge: 2020-06-28 | Disposition: A | Discharge status: Home / Self Care | Payer: Medicaid Other

## 2020-06-28 ENCOUNTER — Encounter (HOSPITAL_COMMUNITY): Payer: Self-pay | Admitting: Pediatrics

## 2020-06-28 DIAGNOSIS — I10 Essential (primary) hypertension: Secondary | ICD-10-CM | POA: Diagnosis not present

## 2020-06-28 DIAGNOSIS — Z5321 Procedure and treatment not carried out due to patient leaving prior to being seen by health care provider: Secondary | ICD-10-CM | POA: Insufficient documentation

## 2020-06-28 DIAGNOSIS — R040 Epistaxis: Secondary | ICD-10-CM | POA: Diagnosis present

## 2020-06-28 MED ORDER — OXYMETAZOLINE HCL 0.05 % NA SOLN
1.0000 | Freq: Once | NASAL | Status: AC
  Administered 2020-06-28: 1 via NASAL
  Filled 2020-06-28: qty 30

## 2020-06-28 NOTE — ED Provider Notes (Signed)
Patient evaluated briefly during triage. She has severely elevated blood pressure and profuse epistaxis. Recommended evaluation in the emergency room.    Wallis Bamberg, PA-C 06/28/20 1711

## 2020-06-28 NOTE — ED Triage Notes (Signed)
From urgent care. C/O nose bleed on both sides. Stated hx of HTN,

## 2020-06-28 NOTE — ED Notes (Signed)
Wallis Bamberg PA made aware of patients blood pressure. Patient asked to pinch nose and hold head forward. Mask removed to allow for pinching and comfort.

## 2020-06-28 NOTE — ED Notes (Signed)
Medium size rhino rocket placed on bilateral nostrils

## 2020-06-28 NOTE — ED Triage Notes (Addendum)
Patient reports nose bleed today, EMS came and was able to stop and then started back up. States started on right nare.   No hx of same. Hx of HTN has been out of norvasc x 1 month, has refilled it. States EMS stated that blood pressure was high on eval.

## 2020-06-28 NOTE — ED Notes (Signed)
Called pt for vitals and no answer  

## 2021-04-06 ENCOUNTER — Other Ambulatory Visit: Payer: Self-pay | Admitting: Obstetrics

## 2021-04-06 DIAGNOSIS — N760 Acute vaginitis: Secondary | ICD-10-CM

## 2021-04-06 DIAGNOSIS — A5901 Trichomonal vulvovaginitis: Secondary | ICD-10-CM

## 2021-04-06 DIAGNOSIS — B9689 Other specified bacterial agents as the cause of diseases classified elsewhere: Secondary | ICD-10-CM

## 2021-04-07 NOTE — Telephone Encounter (Signed)
Patient has not been seen in office since 03/18/2020

## 2021-09-01 ENCOUNTER — Telehealth: Payer: Self-pay | Admitting: Family Medicine

## 2021-09-01 NOTE — Telephone Encounter (Signed)
**  schedule not reschedule** ?

## 2021-09-01 NOTE — Telephone Encounter (Signed)
Patient was referred, I called but was unable to reach. Left voicemail to call back and reschedule. ?

## 2022-02-03 ENCOUNTER — Other Ambulatory Visit: Payer: Self-pay

## 2022-02-03 ENCOUNTER — Ambulatory Visit (INDEPENDENT_AMBULATORY_CARE_PROVIDER_SITE_OTHER): Payer: Medicaid Other

## 2022-02-03 ENCOUNTER — Other Ambulatory Visit (HOSPITAL_COMMUNITY)
Admission: RE | Admit: 2022-02-03 | Discharge: 2022-02-03 | Disposition: A | Discharge status: Home / Self Care | Payer: Medicaid Other | Source: Ambulatory Visit

## 2022-02-03 VITALS — BP 141/90 | HR 80 | Wt 175.0 lb

## 2022-02-03 DIAGNOSIS — Z01419 Encounter for gynecological examination (general) (routine) without abnormal findings: Secondary | ICD-10-CM | POA: Diagnosis not present

## 2022-02-03 DIAGNOSIS — F419 Anxiety disorder, unspecified: Secondary | ICD-10-CM | POA: Diagnosis not present

## 2022-02-03 DIAGNOSIS — N760 Acute vaginitis: Secondary | ICD-10-CM | POA: Insufficient documentation

## 2022-02-03 DIAGNOSIS — Z113 Encounter for screening for infections with a predominantly sexual mode of transmission: Secondary | ICD-10-CM | POA: Insufficient documentation

## 2022-02-03 NOTE — Progress Notes (Signed)
   Subjective:     Desiree Daniel is a 36 y.o. female here at [facility] for a routine exam.  Current complaints: vaginal itching since Sunday.  Personal health questionnaire reviewed: yes.   Health Maintenance Due  Topic Date Due   COVID-19 Vaccine (1) Never done   Hepatitis C Screening  Never done   INFLUENZA VACCINE  01/18/2022     Risk factors for chronic health problems: Smoking: No Alchohol/how much: Socially Illicit drug use: Uses THC Pt BMI: Body mass index is 30.04 kg/m.   Gynecologic History Patient's last menstrual period was 01/17/2022 (exact date). Contraception: none Sexual health: 1 female partner. No issues Last Pap: 02/2020. Results were: normal Last mammogram: n/a  Obstetric History OB History  Gravida Para Term Preterm AB Living  3 1 1  0 1 1  SAB IAB Ectopic Multiple Live Births  1 0 0 0 1    # Outcome Date GA Lbr Len/2nd Weight Sex Delivery Anes PTL Lv  3 Term 02/10/11 [redacted]w[redacted]d 21:46 / 01:07 7 lb 10 oz (3.459 kg) F Vag-Spont EPI  LIV  2 SAB 2009 [redacted]w[redacted]d   U         Birth Comments: 16weeks PPROM SAB  1 Gravida              The following portions of the patient's history were reviewed and updated as appropriate: allergies, current medications, past family history, past medical history, past social history, past surgical history, and problem list.  Review of Systems Pertinent items are noted in HPI.    Objective:   BP (!) 141/90   Pulse 80   Wt 175 lb (79.4 kg)   LMP 01/17/2022 (Exact Date)   BMI 30.04 kg/m  VS reviewed, nursing note reviewed,  Constitutional: well developed, well nourished, no distress HEENT: normocephalic CV: normal rate Pulm/chest wall: normal effort Breast Exam: right breast normal without mass, skin or nipple changes or axillary nodes, left breast normal without mass, skin or nipple changes or axillary nodes Abdomen: soft Neuro: alert and oriented x 3 Skin: warm, dry Psych: affect normal Pelvic exam: patient  self-swabbed   Assessment/Plan:  1. Screening examination for STD (sexually transmitted disease) - Requesting STI screening  - Cervicovaginal ancillary only( [place]) - Hepatitis B Surface AntiGEN - Hepatitis C Antibody - HIV antibody (with reflex) - RPR  2. Anxiety  - Ambulatory referral to Integrated Behavioral Health  3. Well woman exam - Normal well woman exam - Pap up to date - F/u in 1 year for well woman exam   No follow-ups on file.   01/19/2022, CNM 10:18 AM

## 2022-02-04 ENCOUNTER — Other Ambulatory Visit: Payer: Self-pay

## 2022-02-04 DIAGNOSIS — B9689 Other specified bacterial agents as the cause of diseases classified elsewhere: Secondary | ICD-10-CM

## 2022-02-04 LAB — CERVICOVAGINAL ANCILLARY ONLY
Bacterial Vaginitis (gardnerella): POSITIVE — AB
Candida Glabrata: NEGATIVE
Candida Vaginitis: NEGATIVE
Chlamydia: NEGATIVE
Comment: NEGATIVE
Comment: NEGATIVE
Comment: NEGATIVE
Comment: NEGATIVE
Comment: NEGATIVE
Comment: NORMAL
Neisseria Gonorrhea: NEGATIVE
Trichomonas: NEGATIVE

## 2022-02-04 LAB — HIV ANTIBODY (ROUTINE TESTING W REFLEX): HIV Screen 4th Generation wRfx: NONREACTIVE

## 2022-02-04 LAB — HEPATITIS B SURFACE ANTIGEN: Hepatitis B Surface Ag: NEGATIVE

## 2022-02-04 LAB — RPR: RPR Ser Ql: NONREACTIVE

## 2022-02-04 LAB — HEPATITIS C ANTIBODY: Hep C Virus Ab: NONREACTIVE

## 2022-02-04 MED ORDER — METRONIDAZOLE 500 MG PO TABS: 500.0000 mg | ORAL_TABLET | Freq: Two times a day (BID) | ORAL | 0 refills | Status: DC

## 2022-02-04 NOTE — Progress Notes (Signed)
me

## 2022-02-14 ENCOUNTER — Telehealth: Payer: Self-pay | Admitting: Clinical

## 2022-02-14 NOTE — Telephone Encounter (Signed)
Attempt call regarding referral; Left HIPPA-compliant message to call back Peytyn Trine from [facility] at  [phone] (Rayn Enderson's office).    

## 2022-02-15 NOTE — BH Specialist Note (Signed)
Integrated Behavioral Health via Telemedicine Visit  02/28/2022 Desiree Daniel [id number]  Number of Integrated Behavioral Health Clinician visits: 1- Initial Visit  Session Start time: [phone]   Session End time: 0858  Total time in minutes: 42   Referring Provider: Camelia Eng, CNM Patient/Family location: Home [name] Provider location: [facility]  All persons participating in visit: Patient Desiree Daniel and [facility] Suhey Radford   Types of Service: Individual psychotherapy and Video visit  I connected with [name] and/or [name]  n/a  via  Telephone or Video Enabled Telemedicine Application  (Video is Caregility application) and verified that I am speaking with the correct person using two identifiers. Discussed confidentiality: Yes   I discussed the limitations of telemedicine and the availability of in person appointments.  Discussed there is a possibility of technology failure and discussed alternative modes of communication if that failure occurs.  I discussed that engaging in this telemedicine visit, they consent to the provision of behavioral healthcare and the services will be billed under their insurance.  Patient and/or legal guardian expressed understanding and consented to Telemedicine visit: Yes   Presenting Concerns: Patient and/or family reports the following symptoms/concerns: Anxiety, worry, fatigue, irritability, poor sleep; open to implementing self-coping strategy. Duration of problem: Ongoing; Severity of problem: moderate  Patient and/or Family's Strengths/Protective Factors: Social connections, Concrete supports in place (healthy food, safe environments, etc.), Sense of purpose, and Physical Health (exercise, healthy diet, medication compliance, etc.)  Goals Addressed: Patient will:  Reduce symptoms of: anxiety and depression   Increase knowledge  and/or ability of: self-management skills   Demonstrate ability to: Increase motivation to adhere to plan of care  Progress towards Goals: Ongoing  Interventions: Interventions utilized:  CBT Cognitive Behavioral Therapy Standardized Assessments completed: Not Needed  Patient and/or Family Response: Patient agrees with treatment plan.   Assessment: Patient currently experiencing Anxiety disorder, unspecified.   Patient may benefit from psychoeducation and brief therapeutic interventions regarding coping with symptoms of anxiety and depression .  Plan: Follow up with behavioral health clinician on : Two weeks Behavioral recommendations:  -Begin Worry Time strategy, as discussed. Start by setting up start and end time reminders on phone today; continue daily for two weeks. -Continue using waterfall and fan sounds at night; consider apps on After Visit Summary as needed Referral(s): Integrated [employer] (In Clinic)  I discussed the assessment and treatment plan with the patient and/or parent/guardian. They were provided an opportunity to ask questions and all were answered. They agreed with the plan and demonstrated an understanding of the instructions.   They were advised to call back or seek an in-person evaluation if the symptoms worsen or if the condition fails to improve as anticipated.  Rae Lips, LCSW     02/03/2022   11:20 AM  Depression screen PHQ 2/9  Decreased Interest 2  Down, Depressed, Hopeless 2  PHQ - 2 Score 4  Altered sleeping 2  Tired, decreased energy 2  Change in appetite 1  Feeling bad or failure about yourself  1  Trouble concentrating 2  Moving slowly or fidgety/restless 0  Suicidal thoughts 0  PHQ-9 Score 12      02/03/2022   11:21 AM  GAD 7 : Generalized Anxiety Score  Nervous, Anxious, on Edge 2  Control/stop worrying 2  Worry too much - different things 2  Trouble relaxing 1  Restless 1  Easily annoyed or  irritable 3  Afraid - awful might happen 1  Total GAD 7 Score 12

## 2022-02-28 ENCOUNTER — Ambulatory Visit (INDEPENDENT_AMBULATORY_CARE_PROVIDER_SITE_OTHER): Payer: Medicaid Other | Admitting: Clinical

## 2022-02-28 DIAGNOSIS — F419 Anxiety disorder, unspecified: Secondary | ICD-10-CM | POA: Diagnosis not present

## 2022-02-28 NOTE — Patient Instructions (Signed)
[facility] [address] [place], [place] [address] [phone] (main office) [phone] (Leone Mobley's office)  /Emotional Wellbeing Apps and Websites Here are a few free apps meant to help you to help yourself.  To find, try searching on the internet to see if the app is offered on Apple/Android devices. If your first choice doesn't come up on your device, the good news is that there are many choices! Play around with different apps to see which ones are helpful to you.    Calm This is an app meant to help increase calm feelings. Includes info, strategies, and tools for tracking your feelings.      Calm Harm  This app is meant to help with self-harm. Provides many 5-minute or 15-min coping strategies for doing instead of hurting yourself.       Healthy Minds Health Minds is a problem-solving tool to help deal with emotions and cope with stress you encounter wherever you are.      MindShift This app can help people cope with anxiety. Rather than trying to avoid anxiety, you can make an important shift and face it.      MY3  MY3 features a support system, safety plan and resources with the goal of offering a tool to use in a time of need.       My Life My Voice  This mood journal offers a simple solution for tracking your thoughts, feelings and moods. Animated emoticons can help identify your mood.       Relax Melodies Designed to help with sleep, on this app you can mix sounds and meditations for relaxation.      Smiling Mind Smiling Mind is meditation made easy: it's a simple tool that helps put a smile on your mind.        Stop, Breathe & Think  A friendly, simple guide for people through meditations for mindfulness and compassion.  Stop, Breathe and Think Kids Enter your current feelings and choose a "mission" to help you cope. Offers videos for certain moods instead of just sound recordings.       Team  Orange The goal of this tool is to help teens change how they think, act, and react. This app helps you focus on your own good feelings and experiences.      The Virtual Hope Box The Virtual Hope Box (VHB) contains simple tools to help patients with coping, relaxation, distraction, and positive thinking.     

## 2022-03-01 NOTE — BH Specialist Note (Signed)
Integrated Behavioral Health via Telemedicine Visit  03/14/2022 [name] [id number]  Number of Integrated Behavioral Health Clinician visits: 2- Second Visit  Session Start time: 0815   Session End time: 0839  Total time in minutes: 24   Referring Provider: Camelia Eng, CNM Patient/Family location: Home [facility] Provider location: [facility]  All persons participating in visit: Patient Desiree Daniel and [facility] [place]   Types of Service: Individual psychotherapy and Video visit  I connected with Rayhana Venora Maples and/or [name]  n/a  via  Telephone or Video Enabled Telemedicine Application  (Video is Caregility application) and verified that I am speaking with the correct person using two identifiers. Discussed confidentiality: Yes   I discussed the limitations of telemedicine and the availability of in person appointments.  Discussed there is a possibility of technology failure and discussed alternative modes of communication if that failure occurs.  I discussed that engaging in this telemedicine visit, they consent to the provision of behavioral healthcare and the services will be billed under their insurance.  Patient and/or legal guardian expressed understanding and consented to Telemedicine visit: Yes   Presenting Concerns: Patient and/or family reports the following symptoms/concerns: Continued anxiety and irritability; open to additional self-coping strategy. Duration of problem: Ongoing; Severity of problem: moderate  Patient and/or Family's Strengths/Protective Factors: Social connections, Concrete supports in place (healthy food, safe environments, etc.), Sense of purpose, and Physical Health (exercise, healthy diet, medication compliance, etc.)  Goals Addressed: Patient will:  Reduce symptoms of: anxiety and depression   Increase knowledge and/or ability of:  self-management skills   Demonstrate ability to: Increase motivation to adhere to plan of care  Progress towards Goals: Ongoing  Interventions: Interventions utilized:  Mindfulness or Relaxation Training Standardized Assessments completed: Not Needed  Patient and/or Family Response: Patient agrees with treatment plan.   Assessment: Patient currently experiencing Anxiety disorder, unspecified .   Patient may benefit from continued therapeutic interventions.  Plan: Follow up with behavioral health clinician on : Two weeks Behavioral recommendations:  -CALM relaxation breathing exercise twice daily (morning; at bedtime with sleep sounds); as needed throughout the day. Referral(s): Integrated [employer] (In Clinic)  I discussed the assessment and treatment plan with the patient and/or parent/guardian. They were provided an opportunity to ask questions and all were answered. They agreed with the plan and demonstrated an understanding of the instructions.   They were advised to call back or seek an in-person evaluation if the symptoms worsen or if the condition fails to improve as anticipated.  Rae Lips, LCSW     02/03/2022   11:20 AM  Depression screen PHQ 2/9  Decreased Interest 2  Down, Depressed, Hopeless 2  PHQ - 2 Score 4  Altered sleeping 2  Tired, decreased energy 2  Change in appetite 1  Feeling bad or failure about yourself  1  Trouble concentrating 2  Moving slowly or fidgety/restless 0  Suicidal thoughts 0  PHQ-9 Score 12      02/03/2022   11:21 AM  GAD 7 : Generalized Anxiety Score  Nervous, Anxious, on Edge 2  Control/stop worrying 2  Worry too much - different things 2  Trouble relaxing 1  Restless 1  Easily annoyed or irritable 3  Afraid - awful might happen 1  Total GAD 7 Score 12

## 2022-03-14 ENCOUNTER — Ambulatory Visit (INDEPENDENT_AMBULATORY_CARE_PROVIDER_SITE_OTHER): Payer: Medicaid Other | Admitting: Clinical

## 2022-03-14 DIAGNOSIS — F419 Anxiety disorder, unspecified: Secondary | ICD-10-CM

## 2022-03-14 NOTE — Patient Instructions (Signed)
[facility] [address] [place], [place] [address] [phone] (main office) [phone] (Shia Eber's office)   

## 2022-03-17 NOTE — BH Specialist Note (Signed)
Integrated Behavioral Health via Telemedicine Visit  03/17/2022 Sylvie Etheridge [id number]  Number of Integrated Behavioral Health Clinician visits: 2- Second Visit  Session Start time: 0815   Session End time: 0839  Total time in minutes: 24   Referring Provider: Maryagnes Amos, CNM Patient/Family location: Home [facility] Provider location: [facility] [place] [facility]  All persons participating in visit: Patient Desiree Daniel and [place]   Types of Service:   I connected with Desiree Daniel and/or Desiree Daniel's  n/a  via  Telephone or [employer]  (Video is Caregility application) and verified that I am speaking with the correct person using two identifiers. Discussed confidentiality: Yes   I discussed the limitations of telemedicine and the availability of in person appointments.  Discussed there is a possibility of technology failure and discussed alternative modes of communication if that failure occurs.  I discussed that engaging in this telemedicine visit, they consent to the provision of behavioral healthcare and the services will be billed under their insurance.  Patient and/or legal guardian expressed understanding and consented to Telemedicine visit: Yes   Presenting Concerns: Patient and/or family reports the following symptoms/concerns: Goal is to feel "less anxious, less frustrated, let go of things I can't control and go with the flow"; open to implementing additional mindfulness/grounding strategy today. Duration of problem: Ongoing; Severity of problem: moderate  Patient and/or Family's Strengths/Protective Factors: Social connections, Concrete supports in place (healthy food, safe environments, etc.), Sense of purpose, and Physical Health (exercise, healthy diet, medication compliance, etc.)  Goals Addressed: Patient will:  Reduce symptoms of: anxiety and  depression   Increase knowledge and/or ability of: self-management skills   Demonstrate ability to: Increase motivation to adhere to plan of care  Progress towards Goals: Ongoing  Interventions: Interventions utilized:  Mindfulness or Relaxation Training Standardized Assessments completed: GAD-7 and PHQ 9  Patient and/or Family Response: Patient agrees with treatment plan.   Assessment: Patient currently experiencing Anxiety disorder .   Patient may benefit from continued therapeutic interventions .  Plan: Follow up with behavioral health clinician on : Call Maraki Macquarrie at [phone], as needed. Behavioral recommendations:  -Continue using self-coping strategies daily as needed; add grounding strategy practiced today as needed Referral(s): [place] (In Clinic)  I discussed the assessment and treatment plan with the patient and/or parent/guardian. They were provided an opportunity to ask questions and all were answered. They agreed with the plan and demonstrated an understanding of the instructions.   They were advised to call back or seek an in-person evaluation if the symptoms worsen or if the condition fails to improve as anticipated.  Garlan Fair, LCSW     03/31/2022    8:17 AM 02/03/2022   11:20 AM  Depression screen PHQ 2/9  Decreased Interest 2 2  Down, Depressed, Hopeless 2 2  PHQ - 2 Score 4 4  Altered sleeping 3 2  Tired, decreased energy 3 2  Change in appetite 1 1  Feeling bad or failure about yourself  0 1  Trouble concentrating 2 2  Moving slowly or fidgety/restless 0 0  Suicidal thoughts 0 0  PHQ-9 Score 13 12      03/31/2022    8:20 AM 02/03/2022   11:21 AM  GAD 7 : Generalized Anxiety Score  Nervous, Anxious, on Edge 2 2  Control/stop worrying 2 2  Worry too much - different things 3 2  Trouble  relaxing 3 1  Restless 0 1  Easily annoyed or irritable 3 3  Afraid - awful might happen 0 1  Total GAD 7 Score 13 12

## 2022-03-31 ENCOUNTER — Ambulatory Visit (INDEPENDENT_AMBULATORY_CARE_PROVIDER_SITE_OTHER): Payer: Medicaid Other | Admitting: Clinical

## 2022-03-31 DIAGNOSIS — F419 Anxiety disorder, unspecified: Secondary | ICD-10-CM | POA: Diagnosis not present

## 2022-03-31 NOTE — Patient Instructions (Signed)
[facility] [place], [place] [address] [phone] (main office) [phone] ([place] office)  Grounding strategy:   *5 Things you see *4 Things you hear *3 Things you feel *2 Things you smell *1 Thing you taste

## 2022-05-05 ENCOUNTER — Other Ambulatory Visit: Payer: Self-pay | Admitting: Internal Medicine

## 2022-05-06 LAB — ANA: Anti Nuclear Antibody (ANA): NEGATIVE

## 2022-05-06 LAB — SEDIMENTATION RATE: Sed Rate: 17 mm/h (ref 0–20)

## 2022-05-06 LAB — C-REACTIVE PROTEIN: CRP: 8 mg/L — ABNORMAL HIGH (ref <8.0)

## 2022-05-06 LAB — RHEUMATOID FACTOR: Rheumatoid fact SerPl-aCnc: <14 IU/mL (ref <14)

## 2022-05-06 LAB — URIC ACID: Uric Acid, Serum: 3.5 mg/dL (ref 2.5–7.0)

## 2022-09-01 ENCOUNTER — Other Ambulatory Visit: Payer: Self-pay | Admitting: Internal Medicine

## 2022-09-02 LAB — CBC
HCT: 39.6 % (ref 35.0–45.0)
Hemoglobin: 13.7 g/dL (ref 11.7–15.5)
MCH: 30.9 pg (ref 27.0–33.0)
MCHC: 34.6 g/dL (ref 32.0–36.0)
MCV: 89.2 fL (ref 80.0–100.0)
MPV: 10.7 fL (ref 7.5–12.5)
Platelets: 288 10*3/uL (ref 140–400)
RBC: 4.44 10*6/uL (ref 3.80–5.10)
RDW: 12.4 % (ref 11.0–15.0)
WBC: 6.7 10*3/uL (ref 3.8–10.8)

## 2022-09-02 LAB — COMPLETE METABOLIC PANEL WITH GFR
AG Ratio: 1.1 (calc) (ref 1.0–2.5)
ALT: 14 U/L (ref 6–29)
AST: 12 U/L (ref 10–30)
Albumin: 4.1 g/dL (ref 3.6–5.1)
Alkaline phosphatase (APISO): 52 U/L (ref 31–125)
BUN: 11 mg/dL (ref 7–25)
CO2: 23 mmol/L (ref 20–32)
Calcium: 9.5 mg/dL (ref 8.6–10.2)
Chloride: 102 mmol/L (ref 98–110)
Creat: 0.76 mg/dL (ref 0.50–0.97)
Globulin: 3.8 g/dL (calc) — ABNORMAL HIGH (ref 1.9–3.7)
Glucose, Bld: 81 mg/dL (ref 65–99)
Potassium: 4 mmol/L (ref 3.5–5.3)
Sodium: 137 mmol/L (ref 135–146)
Total Bilirubin: 0.5 mg/dL (ref 0.2–1.2)
Total Protein: 7.9 g/dL (ref 6.1–8.1)
eGFR: 103 mL/min/{1.73_m2} (ref >=60)

## 2022-09-02 LAB — LIPID PANEL
Cholesterol: 202 mg/dL — ABNORMAL HIGH (ref <200)
HDL: 76 mg/dL (ref >=50)
LDL Cholesterol (Calc): 109 mg/dL (calc) — ABNORMAL HIGH
Non-HDL Cholesterol (Calc): 126 mg/dL (calc) (ref <130)
Total CHOL/HDL Ratio: 2.7 (calc) (ref <5.0)
Triglycerides: 82 mg/dL (ref <150)

## 2022-09-02 LAB — VITAMIN D 25 HYDROXY (VIT D DEFICIENCY, FRACTURES): Vit D, 25-Hydroxy: 51 ng/mL (ref 30–100)

## 2022-09-02 LAB — TSH: TSH: 0.46 mIU/L

## 2022-10-06 NOTE — Progress Notes (Signed)
 Chief Complaint  Patient presents with  . New Patient    HS     SUBJECTIVE: Desiree Daniel is a 37 y.o. female who is a new  patient to the clinic. 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 No treatment. Also, in  the last 6 years has had recurrent sores on mouth . Was seen by ENT 12/23 , prescribed kenalog and orabase with remission of ulcer on tongue (lateral right side  noted in the chart )  Denies family hx of HS Smoker     ROS:  Constitutional: The patient states that they otherwise feel well.  Skin: Negative for any other lumps, bumps, or skin concerns.   Past medical history, family history, and social history is noted in the encounter and has been updated and reviewed.   Past Medical History No past medical history on file. Family History No family history on file. Social History Social History   Socioeconomic History  . Marital status: Single    Spouse name: Not on file  . Number of children: Not on file  . Years of education: Not on file  . Highest education level: Not on file  Occupational History  . Not on file  Tobacco Use  . Smoking status: Never  . Smokeless tobacco: Never  Substance and Sexual Activity  . Alcohol use: Yes  . Drug use: Yes    Types: Marijuana  . Sexual activity: Not on file  Other Topics Concern  . Not on file  Social History Narrative  . Not on file   Social Determinants of Health   Food Insecurity: Not on file  Transportation Needs: Not on file  Safety: Not on file  Living Situation: Not on file   OBJECTIVE: BP 142/88   Pulse 105   Ht 1.651 m (5' 5)   Wt 82.5 kg (181 lb 12.8 oz)   SpO2 99%   BMI 30.25 kg/m  Patient is a  37 y.o. female who appears A&Ox3 with appropriate mood and affect.   Skin examination was performed via inspection and palpation of the scalp, head (face, ears), eyelids, lips, neck, chest, abdomen, back,  bilateral upper extremities, bilateral hands, fingers, and thighs  The areas covered by the patient's undergarments were not examined today.  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 Suppurative hidradenitis  doxycycline  (VIBRAMYCIN ) 100 mg capsule   spironolactone (ALDACTONE) 50 mg tablet   clindamycin  (CLEOCIN  T) 1 % solution    2. Aphthae  clobetasoL (TEMOVATE) 0.05 % gel       PLAN: Extensive counseling was provided to the patient regarding the diagnosis and treatment options. Oral biopsy option discussed. Advised to stop smoking.  Prescribed:- doxycycline  (VIBRAMYCIN ) 100 mg capsule; Take 1 capsule (100 mg total) by mouth daily. Take with 8 oz water. Do not lie down for at least 30 minutes after.  Dispense: 45 capsule; Refill: 0 - spironolactone (ALDACTONE) 50 mg tablet; Take 1 tablet (50 mg total) by mouth every morning.  Dispense: 45 tablet; Refill: 1 - clindamycin  (CLEOCIN  T) 1 % solution; Apply topically 2 (two) times a  day. To affected areas  Dispense: 60 mL; Refill: 6 - clobetasoL (TEMOVATE) 0.05 % gel; Apply 1 Application topically 2 (two) times a day. To affected area on tongue for 10 days and stop it  Dispense: 30 g; Refill: 1  Side effects and appropriate use of all the medication(s) were discussed with the patient today. Risks, benefits, and alternatives were discussed. Patient advised to use the medication(s) as directed by their healthcare provider. The patient was encouraged to read, review, and understand all associated package inserts and contact our office with any questions or concerns. The patient accepts the risks of the treatment plan and had an opportunity to ask questions.   Advised monthly self total body skin examination and for the patient to report any new or changing lesions. The patient was encouraged to call or send a message through [facility] for any questions or concerns.  Follow up 3 months or sooner as needed.  Electronically signed by: Daniel Doyal LILLETTE Desiree Hildegard, NP 10/06/2022 11:30 AM    Scheduled Future Appointments       Provider Department Dept Phone Center   10/06/2022 2:00 PM Doyal LILLETTE Desiree Daniel [facility] [place]  General Dermatology [phone] [facility] MP LOISE Danas

## 2023-06-01 ENCOUNTER — Other Ambulatory Visit (HOSPITAL_COMMUNITY)
Admission: RE | Admit: 2023-06-01 | Discharge: 2023-06-01 | Disposition: A | Discharge status: Home / Self Care | Payer: Medicaid Other | Source: Ambulatory Visit | Attending: Family Medicine | Admitting: Family Medicine

## 2023-06-01 ENCOUNTER — Ambulatory Visit (INDEPENDENT_AMBULATORY_CARE_PROVIDER_SITE_OTHER): Payer: Medicaid Other

## 2023-06-01 ENCOUNTER — Other Ambulatory Visit: Payer: Self-pay

## 2023-06-01 VITALS — BP 153/91 | HR 94 | Ht 64.0 in | Wt 182.3 lb

## 2023-06-01 DIAGNOSIS — R8290 Unspecified abnormal findings in urine: Secondary | ICD-10-CM

## 2023-06-01 DIAGNOSIS — N898 Other specified noninflammatory disorders of vagina: Secondary | ICD-10-CM | POA: Insufficient documentation

## 2023-06-01 LAB — POCT URINALYSIS DIP (DEVICE)
Glucose, UA: NEGATIVE mg/dL
Hgb urine dipstick: NEGATIVE
Ketones, ur: 40 mg/dL — AB
Leukocytes,Ua: NEGATIVE
Nitrite: NEGATIVE
Protein, ur: NEGATIVE mg/dL
Specific Gravity, Urine: >=1.03 (ref 1.005–1.030)
Urobilinogen, UA: 0.2 mg/dL (ref 0.0–1.0)
pH: 6 (ref 5.0–8.0)

## 2023-06-01 NOTE — Progress Notes (Signed)
Pt here today for questionable UTI.  Pt reports severe back pain, no burning with urination or pain, and scant vaginal discharge.  UA results small bili and 40 ketones.  Pt advised that we will send urine for culture and if there is anything abnormal then we will call her.  Pt explained how to obtain self swab and that we will also call with abnormal results.  Pt presents with elevated BP 151/102 LA.  Rpt BP 153/91 RA.  Pt denies headache and changes in vision.  Pt states that she has taken Norvasc for a while and that she is planning to reschedule her PCP appt to f/u in January.   Pt encouraged to please make sure that schedules another appt with PCP asap so that can get her BP under control.  Pt verbalized understanding with no further questions.     Desiree Daniel  06/01/23

## 2023-06-02 LAB — URINE CULTURE

## 2023-06-05 LAB — CERVICOVAGINAL ANCILLARY ONLY
Bacterial Vaginitis (gardnerella): POSITIVE — AB
Candida Glabrata: NEGATIVE
Candida Vaginitis: NEGATIVE
Chlamydia: NEGATIVE
Comment: NEGATIVE
Comment: NEGATIVE
Comment: NEGATIVE
Comment: NEGATIVE
Comment: NEGATIVE
Comment: NORMAL
Neisseria Gonorrhea: NEGATIVE
Trichomonas: NEGATIVE

## 2023-06-05 MED ORDER — METRONIDAZOLE 500 MG PO TABS
500.0000 mg | ORAL_TABLET | Freq: Two times a day (BID) | ORAL | 0 refills | Status: AC
Start: 2023-06-05 — End: 2023-06-12

## 2023-06-05 NOTE — Addendum Note (Signed)
Addended by: Reva Bores on: 06/05/2023 05:46 PM   Modules accepted: Orders

## 2024-01-05 ENCOUNTER — Encounter: Payer: Self-pay | Admitting: Advanced Practice Midwife
